# Patient Record
Sex: Male | Born: 1955 | Race: White | Hispanic: No | Marital: Married | Smoking: Former smoker
Health system: Southern US, Community
[De-identification: ages and names within clinical notes are randomized; demographics above are authoritative.]

## PROBLEM LIST (undated history)

## (undated) ENCOUNTER — Ambulatory Visit (HOSPITAL_COMMUNITY): Admission: EM | Payer: Medicare Other | Source: Home / Self Care

## (undated) DIAGNOSIS — F329 Major depressive disorder, single episode, unspecified: Secondary | ICD-10-CM

## (undated) DIAGNOSIS — E785 Hyperlipidemia, unspecified: Secondary | ICD-10-CM

## (undated) DIAGNOSIS — R7303 Prediabetes: Secondary | ICD-10-CM

## (undated) DIAGNOSIS — J189 Pneumonia, unspecified organism: Secondary | ICD-10-CM

## (undated) DIAGNOSIS — J069 Acute upper respiratory infection, unspecified: Secondary | ICD-10-CM

## (undated) DIAGNOSIS — F32A Depression, unspecified: Secondary | ICD-10-CM

## (undated) DIAGNOSIS — F909 Attention-deficit hyperactivity disorder, unspecified type: Secondary | ICD-10-CM

## (undated) DIAGNOSIS — I10 Essential (primary) hypertension: Secondary | ICD-10-CM

## (undated) DIAGNOSIS — Z720 Tobacco use: Secondary | ICD-10-CM

## (undated) DIAGNOSIS — F419 Anxiety disorder, unspecified: Secondary | ICD-10-CM

## (undated) DIAGNOSIS — E7800 Pure hypercholesterolemia, unspecified: Secondary | ICD-10-CM

## (undated) DIAGNOSIS — I209 Angina pectoris, unspecified: Secondary | ICD-10-CM

## (undated) DIAGNOSIS — H9190 Unspecified hearing loss, unspecified ear: Secondary | ICD-10-CM

## (undated) HISTORY — DX: Hyperlipidemia, unspecified: E78.5

## (undated) HISTORY — DX: Essential (primary) hypertension: I10

## (undated) HISTORY — DX: Major depressive disorder, single episode, unspecified: F32.9

## (undated) HISTORY — PX: COLONOSCOPY: SHX174

## (undated) HISTORY — DX: Depression, unspecified: F32.A

## (undated) HISTORY — PX: WISDOM TOOTH EXTRACTION: SHX21

---

## 1997-11-19 ENCOUNTER — Ambulatory Visit (HOSPITAL_COMMUNITY): Admission: RE | Admit: 1997-11-19 | Discharge: 1997-11-19 | Payer: Self-pay | Admitting: Family Medicine

## 1999-12-09 ENCOUNTER — Encounter: Payer: Self-pay | Admitting: Occupational Medicine

## 1999-12-09 ENCOUNTER — Encounter: Admission: RE | Admit: 1999-12-09 | Discharge: 1999-12-09 | Payer: Self-pay | Admitting: Occupational Medicine

## 2001-12-22 ENCOUNTER — Encounter: Payer: Self-pay | Admitting: Family Medicine

## 2001-12-22 ENCOUNTER — Encounter: Admission: RE | Admit: 2001-12-22 | Discharge: 2001-12-22 | Payer: Self-pay | Admitting: Family Medicine

## 2003-06-14 ENCOUNTER — Emergency Department (HOSPITAL_COMMUNITY): Admission: EM | Admit: 2003-06-14 | Discharge: 2003-06-14 | Payer: Self-pay

## 2003-12-19 ENCOUNTER — Encounter: Admission: RE | Admit: 2003-12-19 | Discharge: 2003-12-19 | Payer: Self-pay | Admitting: Occupational Medicine

## 2004-08-11 ENCOUNTER — Ambulatory Visit: Payer: Self-pay | Admitting: Gastroenterology

## 2004-08-12 ENCOUNTER — Ambulatory Visit: Payer: Self-pay | Admitting: Gastroenterology

## 2004-08-12 ENCOUNTER — Encounter (INDEPENDENT_AMBULATORY_CARE_PROVIDER_SITE_OTHER): Payer: Self-pay | Admitting: *Deleted

## 2006-01-25 ENCOUNTER — Encounter: Admission: RE | Admit: 2006-01-25 | Discharge: 2006-01-25 | Payer: Self-pay | Admitting: Occupational Medicine

## 2007-11-09 ENCOUNTER — Inpatient Hospital Stay (HOSPITAL_COMMUNITY): Admission: EM | Admit: 2007-11-09 | Discharge: 2007-11-10 | Payer: Self-pay | Admitting: Emergency Medicine

## 2007-11-09 ENCOUNTER — Ambulatory Visit: Payer: Self-pay | Admitting: Infectious Disease

## 2007-11-10 ENCOUNTER — Inpatient Hospital Stay (HOSPITAL_COMMUNITY): Admission: RE | Admit: 2007-11-10 | Discharge: 2007-11-17 | Payer: Self-pay | Admitting: *Deleted

## 2007-11-10 ENCOUNTER — Ambulatory Visit: Payer: Self-pay | Admitting: *Deleted

## 2007-12-13 ENCOUNTER — Encounter (INDEPENDENT_AMBULATORY_CARE_PROVIDER_SITE_OTHER): Payer: Self-pay | Admitting: *Deleted

## 2007-12-13 ENCOUNTER — Ambulatory Visit: Payer: Self-pay | Admitting: Internal Medicine

## 2007-12-13 DIAGNOSIS — F3289 Other specified depressive episodes: Secondary | ICD-10-CM | POA: Insufficient documentation

## 2007-12-13 DIAGNOSIS — E785 Hyperlipidemia, unspecified: Secondary | ICD-10-CM | POA: Insufficient documentation

## 2007-12-13 DIAGNOSIS — I10 Essential (primary) hypertension: Secondary | ICD-10-CM | POA: Insufficient documentation

## 2007-12-13 DIAGNOSIS — F329 Major depressive disorder, single episode, unspecified: Secondary | ICD-10-CM | POA: Insufficient documentation

## 2007-12-13 DIAGNOSIS — L299 Pruritus, unspecified: Secondary | ICD-10-CM | POA: Insufficient documentation

## 2007-12-13 DIAGNOSIS — I781 Nevus, non-neoplastic: Secondary | ICD-10-CM | POA: Insufficient documentation

## 2007-12-13 LAB — CONVERTED CEMR LAB
AST: 23 units/L (ref 0–37)
Albumin: 4.8 g/dL (ref 3.5–5.2)
Alkaline Phosphatase: 63 units/L (ref 39–117)
Glucose, Bld: 127 mg/dL — ABNORMAL HIGH (ref 70–99)
HDL: 42 mg/dL (ref >39)
LDL Cholesterol: 194 mg/dL — ABNORMAL HIGH (ref 0–99)
Potassium: 4.4 meq/L (ref 3.5–5.3)
Sodium: 139 meq/L (ref 135–145)
Total Bilirubin: 0.6 mg/dL (ref 0.3–1.2)
Total CHOL/HDL Ratio: 6.4
Total Protein: 8 g/dL (ref 6.0–8.3)
Triglycerides: 161 mg/dL — ABNORMAL HIGH (ref <150)
VLDL: 32 mg/dL (ref 0–40)

## 2008-02-23 ENCOUNTER — Ambulatory Visit: Payer: Self-pay | Admitting: Internal Medicine

## 2008-05-14 ENCOUNTER — Ambulatory Visit: Payer: Self-pay | Admitting: Internal Medicine

## 2008-06-15 ENCOUNTER — Ambulatory Visit: Payer: Self-pay | Admitting: Internal Medicine

## 2008-06-15 ENCOUNTER — Encounter (INDEPENDENT_AMBULATORY_CARE_PROVIDER_SITE_OTHER): Payer: Self-pay | Admitting: *Deleted

## 2008-06-15 LAB — CONVERTED CEMR LAB
ALT: 21 units/L (ref 0–53)
BUN: 19 mg/dL (ref 6–23)
CO2: 27 meq/L (ref 19–32)
Calcium: 10.1 mg/dL (ref 8.4–10.5)
Chloride: 96 meq/L (ref 96–112)
Cholesterol: 225 mg/dL — ABNORMAL HIGH (ref 0–200)
LDL Cholesterol: 158 mg/dL — ABNORMAL HIGH (ref 0–99)
Potassium: 4.5 meq/L (ref 3.5–5.3)
Total CHOL/HDL Ratio: 5.5
Triglycerides: 130 mg/dL (ref <150)
VLDL: 26 mg/dL (ref 0–40)

## 2008-07-10 ENCOUNTER — Telehealth (INDEPENDENT_AMBULATORY_CARE_PROVIDER_SITE_OTHER): Payer: Self-pay | Admitting: Internal Medicine

## 2008-10-17 ENCOUNTER — Ambulatory Visit: Payer: Self-pay | Admitting: Internal Medicine

## 2008-10-17 ENCOUNTER — Ambulatory Visit (HOSPITAL_COMMUNITY): Admission: RE | Admit: 2008-10-17 | Discharge: 2008-10-17 | Payer: Self-pay | Admitting: Internal Medicine

## 2008-10-17 DIAGNOSIS — M79609 Pain in unspecified limb: Secondary | ICD-10-CM | POA: Insufficient documentation

## 2008-10-17 LAB — CONVERTED CEMR LAB
BUN: 14 mg/dL (ref 6–23)
CO2: 28 meq/L (ref 19–32)
Calcium: 9.8 mg/dL (ref 8.4–10.5)
Chloride: 101 meq/L (ref 96–112)
Cholesterol: 268 mg/dL — ABNORMAL HIGH (ref 0–200)
Creatinine, Ser: 0.9 mg/dL (ref 0.40–1.50)
Glucose, Bld: 99 mg/dL (ref 70–99)
LDL Cholesterol: 194 mg/dL — ABNORMAL HIGH (ref 0–99)
Total CHOL/HDL Ratio: 5.5
VLDL: 25 mg/dL (ref 0–40)

## 2008-10-25 ENCOUNTER — Telehealth: Payer: Self-pay | Admitting: Internal Medicine

## 2008-11-13 ENCOUNTER — Telehealth: Payer: Self-pay | Admitting: Internal Medicine

## 2008-11-26 ENCOUNTER — Telehealth (INDEPENDENT_AMBULATORY_CARE_PROVIDER_SITE_OTHER): Payer: Self-pay | Admitting: *Deleted

## 2009-04-17 ENCOUNTER — Telehealth: Payer: Self-pay | Admitting: Internal Medicine

## 2009-07-01 ENCOUNTER — Encounter: Admission: RE | Admit: 2009-07-01 | Discharge: 2009-07-01 | Payer: Self-pay | Admitting: Internal Medicine

## 2009-09-26 ENCOUNTER — Encounter (INDEPENDENT_AMBULATORY_CARE_PROVIDER_SITE_OTHER): Payer: Self-pay | Admitting: *Deleted

## 2009-11-06 ENCOUNTER — Encounter (INDEPENDENT_AMBULATORY_CARE_PROVIDER_SITE_OTHER): Payer: Self-pay | Admitting: *Deleted

## 2009-11-07 ENCOUNTER — Ambulatory Visit: Payer: Self-pay | Admitting: Gastroenterology

## 2009-11-21 ENCOUNTER — Ambulatory Visit: Payer: Self-pay | Admitting: Gastroenterology

## 2009-11-24 ENCOUNTER — Encounter: Payer: Self-pay | Admitting: Gastroenterology

## 2010-01-12 HISTORY — PX: CARDIAC CATHETERIZATION: SHX172

## 2010-02-11 NOTE — Letter (Signed)
Summary: Patient Notice- Polyp Results  [facility]  [address] [place], [place] [address]   Phone: [phone]  Fax: [phone]        November 24, 2009 MRN: [id number]    [facility] [address] [place], [place]  [address]    Dear Mr. PANAS,  I am pleased to inform you that the colon polyp(s) removed during your recent colonoscopy was (were) found to be benign (no cancer detected) upon pathologic examination.  I recommend you have a repeat colonoscopy examination in 5 years to look for recurrent polyps, as having colon polyps increases your risk for having recurrent polyps or even colon cancer in the future.  Should you develop new or worsening symptoms of abdominal pain, bowel habit changes or bleeding from the rectum or bowels, please schedule an evaluation with either your primary care physician or with me.  Continue treatment plan as outlined the day of your exam.  Please call us if you are having persistent problems or have questions about your condition that have not been fully answered at this time.  Sincerely,  Meryl Dare MD [facility]  This letter has been electronically signed by your physician.  Appended Document: Patient Notice- Polyp Results letter mailed

## 2010-02-11 NOTE — Letter (Signed)
Summary: Pre Visit Letter Revised  [facility]  [address] [place], [place] [address]   Phone: [phone]  Fax: [phone]        09/26/2009 MRN: [id number] [facility] [address] [place], [place]  [address]             Procedure Date:  11-21-09   Welcome to the Gastroenterology Division at [facility].    You are scheduled to see a nurse for your pre-procedure visit on 11-07-09 at 8:00a.m. on the 3rd floor at [facility], [address]  We ask that you try to arrive at our office 15 minutes prior to your appointment time to allow for check-in.  Please take a minute to review the attached form.  If you answer "Yes" to one or more of the questions on the first page, we ask that you call the person listed at your earliest opportunity.  If you answer "No" to all of the questions, please complete the rest of the form and bring it to your appointment.    Your nurse visit will consist of discussing your medical and surgical history, your immediate family medical history, and your medications.   If you are unable to list all of your medications on the form, please bring the medication bottles to your appointment and we will list them.  We will need to be aware of both prescribed and over the counter drugs.  We will need to know exact dosage information as well.    Please be prepared to read and sign documents such as consent forms, a financial agreement, and acknowledgement forms.  If necessary, and with your consent, a friend or relative is welcome to sit-in on the nurse visit with you.  Please bring your insurance card so that we may make a copy of it.  If your insurance requires a referral to see a specialist, please bring your referral form from your primary care physician.  No co-pay is required for this nurse visit.     If you cannot keep your appointment, please call [phone] to cancel or reschedule prior to your appointment date.  This  allows [place] the opportunity to schedule an appointment for another patient in need of care.    Thank you for choosing [facility] for your medical needs.  We appreciate the opportunity to care for you.  Please visit [place] at our website  to learn more about our practice.  Sincerely, The Gastroenterology Division

## 2010-02-11 NOTE — Miscellaneous (Signed)
Summary: Forgot 8am appt; will be here at 3pm/  Clinical Lists Changes  Medications: Added new medication of MOVIPREP 100 GM  SOLR (PEG-KCL-NACL-NASULF-NA ASC-C) As directed - Signed Rx of MOVIPREP 100 GM  SOLR (PEG-KCL-NACL-NASULF-NA ASC-C) As directed;  #1 x 0;  Signed;  Entered by: Clide Cliff RN;  Authorized by: Meryl Dare MD [facility];  Method used: Electronically to [facility]  [phone]*, [address], [place], [place], [place]  [address], Ph: [phone] or [phone], Fax: [phone] Observations: Added new observation of ALLERGY REV: Done (11/07/2009 14:43)    Prescriptions: MOVIPREP 100 GM  SOLR (PEG-KCL-NACL-NASULF-NA ASC-C) As directed  #1 x 0   Entered by:   Clide Cliff RN   Authorized by:   Meryl Dare MD [facility]   Signed by:   Clide Cliff RN on 11/07/2009   Method used:   Electronically to        [facility]  [phone]* (retail)       [address]       [place], [place]  [address]       Ph: [phone] or [phone]       Fax: [phone]   RxID:   [id number]

## 2010-02-11 NOTE — Progress Notes (Signed)
Summary: Refill/gh  Phone Note Refill Request Message from:  Fax from Pharmacy on April 17, 2009 3:37 PM  Refills Requested: Medication #1:  LISINOPRIL-HYDROCHLOROTHIAZIDE 20-25 MG TABS Take 1 tablet by mouth once a day   Last Refilled: 03/01/2009 Last office visit and labs was 10/17/2008   Method Requested: Electronic Initial call taken by: Angelina Ok RN,  April 17, 2009 3:37 PM  Follow-up for Phone Call        Refill approved-nurse to complete Will only give one month supply, patient needs to schedule an appointment.  Follow-up by: Melida Quitter MD,  April 17, 2009 8:06 PM  Additional Follow-up for Phone Call Additional follow up Details #1::        Pt to be scheduled for an appointment. Additional Follow-up by: Angelina Ok RN,  April 18, 2009 3:27 PM    Prescriptions: LISINOPRIL-HYDROCHLOROTHIAZIDE 20-25 MG TABS (LISINOPRIL-HYDROCHLOROTHIAZIDE) Take 1 tablet by mouth once a day  #30 x 0   Entered and Authorized by:   Melida Quitter MD   Signed by:   Melida Quitter MD on 04/17/2009   Method used:   Electronically to        [facility]  [phone]* (retail)       [address]       [place], [place]  [address]       Ph: [phone] or [phone]       Fax: [phone]   RxID:   [id number]

## 2010-02-11 NOTE — Procedures (Signed)
Summary: Colonoscopy  Patient: George Graham Note: All result statuses are Final unless otherwise noted.  Tests: (1) Colonoscopy (COL)   COL Colonoscopy           DONE     [facility]     [address]     [place], [place]  [address]           COLONOSCOPY PROCEDURE REPORT           PATIENT:  [name], [name]  MR#:  [id number]     BIRTHDATE:  1955/02/16, 53 yrs. old  GENDER:  male     ENDOSCOPIST:  Judie Petit T. Russella Dar, MD, [facility]           PROCEDURE DATE:  11/21/2009     PROCEDURE:  Colonoscopy with biopsy     ASA CLASS:  Class II     INDICATIONS:  1) surveillance and high-risk screening  2) history     of adenomatous colon polyp, 08/2004     MEDICATIONS:   Fentanyl 75 mcg IV, Versed 10 mg IV     DESCRIPTION OF PROCEDURE:   After the risks benefits and     alternatives of the procedure were thoroughly explained, informed     consent was obtained.  Digital rectal exam was performed and     revealed no abnormalities.   The LB [id number]-[id number] endoscope     was introduced through the anus and advanced to the cecum, which     was identified by both the appendix and ileocecal valve, without     limitations.  The quality of the prep was excellent, using     MoviPrep.  The instrument was then slowly withdrawn as the colon     was fully examined.     <<PROCEDUREIMAGES>>     FINDINGS:  A sessile polyp was found in the ascending colon. It     was 4 mm in size. The polyp was removed using cold biopsy forceps.     Mild diverticulosis was found in the sigmoid colon.  A normal     appearing cecum, ileocecal valve, and appendiceal orifice were     identified. The hepatic flexure, transverse, splenic flexure,     descending colon, and rectum appeared unremarkable. Retroflexed     views in the rectum revealed no abnormalities. The time to cecum =     2.67  minutes. The scope was then withdrawn (time =  8.33  min)     from the patient and the procedure completed.     COMPLICATIONS:   None           ENDOSCOPIC IMPRESSION:     1) 4 mm sessile polyp in the ascending colon     2) Mild diverticulosis in the sigmoid colon           RECOMMENDATIONS:     1) Await pathology results     2) High fiber diet with liberal fluid intake.     3) Repeat Colonoscopy in 5 years.           Venita Lick. Russella Dar, MD, [facility]           CC: Guerry Bruin, MD           n.     Rosalie DoctorVenita Lick. Stark at 11/21/2009 09:15 AM           [facility], [id number]  Note: An exclamation mark (!) indicates a result that was not dispersed  into the flowsheet. Document Creation Date: 11/21/2009 9:15 AM _______________________________________________________________________  (1) Order result status: Final Collection or observation date-time: 11/21/2009 09:09 Requested date-time:  Receipt date-time:  Reported date-time:  Referring Physician:   Ordering Physician: Claudette Head [phone]) Specimen Source:  Source: Launa Grill Order Number: [id number] Lab site:   Appended Document: Colonoscopy     Procedures Next Due Date:    Colonoscopy: 11/2014

## 2010-02-11 NOTE — Letter (Signed)
Summary: [facility] Instructions  [facility]  [address] [place], [place] [address]   Phone: [phone]  Fax: [phone]       KOUROSH JABLONSKY    11-17-1955    MRN: [id number]        Procedure Day Dorna Bloom:  Lenor Coffin  11/21/09     Arrival Time:  8:00AM     Procedure Time:  9:00AM     Location of Procedure:                    _ X_  [facility] (4th Floor)                    PREPARATION FOR COLONOSCOPY WITH MOVIPREP   Starting 5 days prior to your procedure 11/16/09 do not eat nuts, seeds, popcorn, corn, beans, peas,  salads, or any raw vegetables.  Do not take any fiber supplements (e.g. Metamucil, Citrucel, and Benefiber).  THE DAY BEFORE YOUR PROCEDURE         DATE: 11/20/09  DAY: WEDNESDAY  1.  Drink clear liquids the entire day-NO SOLID FOOD  2.  Do not drink anything colored red or purple.  Avoid juices with pulp.  No orange juice.  3.  Drink at least 64 oz. (8 glasses) of fluid/clear liquids during the day to prevent dehydration and help the prep work efficiently.  CLEAR LIQUIDS INCLUDE: Water Jello Ice Popsicles Tea (sugar ok, no milk/cream) Powdered fruit flavored drinks Coffee (sugar ok, no milk/cream) Gatorade Juice: apple, white grape, white cranberry  Lemonade Clear bullion, consomm, broth Carbonated beverages (any kind) Strained chicken noodle soup Hard Candy                             4.  In the morning, mix first dose of MoviPrep solution:    Empty 1 Pouch A and 1 Pouch B into the disposable container    Add lukewarm drinking water to the top line of the container. Mix to dissolve    Refrigerate (mixed solution should be used within 24 hrs)  5.  Begin drinking the prep at 5:00 p.m. The MoviPrep container is divided by 4 marks.   Every 15 minutes drink the solution down to the next mark (approximately 8 oz) until the full liter is complete.   6.  Follow completed prep with 16 oz of clear liquid of your choice  (Nothing red or purple).  Continue to drink clear liquids until bedtime.  7.  Before going to bed, mix second dose of MoviPrep solution:    Empty 1 Pouch A and 1 Pouch B into the disposable container    Add lukewarm drinking water to the top line of the container. Mix to dissolve    Refrigerate  THE DAY OF YOUR PROCEDURE      DATE: 11/21/09  DAY: THURSDAY  Beginning at 4:00AM (5 hours before procedure):         1. Every 15 minutes, drink the solution down to the next mark (approx 8 oz) until the full liter is complete.  2. Follow completed prep with 16 oz. of clear liquid of your choice.    3. You may drink clear liquids until 7:00AM (2 HOURS BEFORE PROCEDURE).   MEDICATION INSTRUCTIONS  Unless otherwise instructed, you should take regular prescription medications with a small sip of water   as early as possible the morning of your procedure.  OTHER INSTRUCTIONS  You will need a responsible adult at least 55 years of age to accompany you and drive you home.   This person must remain in the waiting room during your procedure.  Wear loose fitting clothing that is easily removed.  Leave jewelry and other valuables at home.  However, you may wish to bring a book to read or  an iPod/MP3 player to listen to music as you wait for your procedure to start.  Remove all body piercing jewelry and leave at home.  Total time from sign-in until discharge is approximately 2-3 hours.  You should go home directly after your procedure and rest.  You can resume normal activities the  day after your procedure.  The day of your procedure you should not:   Drive   Make legal decisions   Operate machinery   Drink alcohol   Return to work  You will receive specific instructions about eating, activities and medications before you leave.    The above instructions have been reviewed and explained to me by   Clide Cliff, RN______________________    I fully  understand and can verbalize these instructions _____________________________ Date _________

## 2010-05-27 NOTE — H&P (Signed)
NAMEFRASER, BUSCHE NO.:  [id number]   MEDICAL RECORD NO.:  [id number]          PATIENT TYPE:  IPS   LOCATION:  0303                          FACILITY:  [facility]   PHYSICIAN:  Jasmine Pang, M.D. DATE OF BIRTH:  03/15/1955   DATE OF ADMISSION:  11/10/2007  DATE OF DISCHARGE:                       PSYCHIATRIC ADMISSION ASSESSMENT   IDENTIFICATION:  A 55 year old divorced Caucasian male.  This is an  involuntary admission.   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  His initial  alcohol level was 333.  He was also initially quite confused and  verbalized to his brother that he intended to load his guns and shoot  himself.  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  Since that time, he has also broken up  with his girlfriend, lost a large amount of savings in his investments,  and now stands to lose his housing.  He feels hopeless about the future  and his ability to get back into the job market, denying any homicidal  thoughts.  No evidence of psychosis.  Please also see the discharge  summary dictated by Dr. Daiva Eves on the medical unit on October 29.   PAST PSYCHIATRIC HISTORY:  First [facility] admission.  He is not currently  under the care of a psychiatrist or counselor at this time.  He has a  history of 2 admissions to [facility] in the distant past and at  that time had 2 DWIs.  Did quite well with many years of abstinence from  alcohol.  He denies any other substance use and is interested in  pursuing treatment following his detox stay here.  He  also denies a  prior history of suicide attempt.  He does report that he had some  history of delirium with alcohol withdrawal in the past.   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  He reports he has very supportive family in the area, many  people concerned about him, and would like to stay in the area to pursue  rehab.   FAMILY MEDICAL HISTORY:  Primary care Lee-Anne Flicker is unclear.   MEDICAL PROBLEMS:  Are hypertension.   CURRENT MEDICATIONS:  1. Lisinopril 20 mg daily.  2. Hydrochlorothiazide 25 mg daily.   DRUG ALLERGIES:  No known drug allergies.   PHYSICAL EXAMINATION:  Was done in the emergency room, is noted in the  record.   Noted that he was admitted with urine drug screen negative for all  substances.  Initial alcohol level 333.  Salicylate and  acetaminophen  levels were negative.  Homocysteine level 27.  CBC:  WBC 5.7, hemoglobin  15.4, hematocrit 43.8, platelets 325,000, and MCV 103.  RDW 13.9.  Chemistry:  Sodium 127, potassium 4.4, chloride 89, carbon dioxide 27,  BUN 6, creatinine 0.8 and random glucose 104.  Liver enzymes:  SGOT 54,  SGPT 65, alkaline phosphatase 64 and total bilirubin 1.2, calcium 10.5.   MENTAL STATUS EXAM:  Fully alert male.  He is oriented x4 today.  Immediate, recent and remote memory are intact, although he does not  have clear memory of the events leading up immediately to the admission.  Acknowledges that he was drinking large amounts of alcohol but that his  drinking was out of control.  Denying any symptoms of fornication,  tremor, or subjective confusion today.  Speech is normal.  Eye contact  is good.  Cooperative but asking for help with his detox and with his  drinking.  Denying that he has any intent to kill myself and says that  he is here to detox, clean up, so I can get back to looking for a job.  Mood is neutral  today.  Thought process logical and coherent.  No  evidence of confusion or delirium today.  Insight adequate.  Impulse  control and judgment within normal limits.   AXIS I:  Alcohol abuse and dependence.  AXIS II:  Deferred.  AXIS III:  Hypertension and elevated transaminases.  AXIS IV:  Severe issues with finances.  AXIS V:  Current 40, past year not known.   PLAN:  Is to admit him to our dual diagnosis unit.  We started him on a  Librium detox protocol, and he will also be getting thiamine 100 mg  daily, folic acid 1 mg daily.  We will recheck his complete metabolic  panel in a couple of days.  Meanwhile, he reports that his family is  very supportive, and we hope to get them involved in his treatment and  going to ask our social worker to talk with the family about securing  the weapons at home.      Margaret A. Lorin Picket, N.P.      Jasmine Pang, M.D.  Electronically Signed    MAS/MEDQ  D:  11/11/2007  T:  11/11/2007  Job:  [id number]

## 2010-05-27 NOTE — Discharge Summary (Signed)
George Graham, George Graham NO.:  [id number]   MEDICAL RECORD NO.:  [id number]          PATIENT TYPE:  INP   LOCATION:  6708                         FACILITY:  [facility]   PHYSICIAN:  Acey Lav, MD  DATE OF BIRTH:  01-Feb-1955   DATE OF ADMISSION:  11/08/2007  DATE OF DISCHARGE:  11/10/2007                               DISCHARGE SUMMARY   ATTENDING PHYSICIAN:  Dr. Paulette Blanch Graham.   DISCHARGE DIAGNOSES:  1. Suicidal ideation.  2. Likely major depressive disorder episode.  3. Hypertensive urgency.  4. Acute ethanol intoxication.  5. Likely ethanol abuse   DISCHARGE MEDICATIONS:  1. P.o. hydrochlorothiazide 25 mg daily.  2. P.o. lisinopril 20 mg daily.   CONDITION ON DISCHARGE:  The patient is to be discharged to the  behavioral health center where he should undergo inpatient psychiatric  rehabilitation.  The patient is otherwise medically stable with a last  blood pressure of 146/80.  The patient will follow up with myself, Dr.  Linward Graham, on December 1 at 9:00 a.m.  His mental state should be  assessed at that time as well as his blood pressure and compliance with  his new medications.  We will evaluate whether the patient was placed on  an antidepressant at [facility] or determine whether an  antidepressant is warranted at that time of follow-up.   PROCEDURES:  The patient had a head CT without contrast on November 09, 2007 that showed no acute intracranial abnormality.  The patient had a  chest x-ray on November 09, 2007 that showed no acute findings.   CONSULTATIONS:  Dr. Jeanie Sewer of psychiatry consulted the patient on  November 10, 2007.  Per his assessment and plan, the patient is to be  admitted to a psychiatric facility as soon as possible.  Psychotropics  will be deferred at this point.  Please see Dr. Providence Crosby dictation  for further details.   ADMISSION HISTORY AND PHYSICAL:  The patient is a 55 year old male with  a history of  hypertension and depression with no previous suicidal  attempts and no hospitalizations who presents after being brought in by  his older brother for alcohol intoxication plus suicidal ideation.  The  patient lost his job around 10 months ago, lost his girlfriend around 1  month ago and is about to be evicted from his home.  The patient had a  suicidal plan to drink himself to death and had loaded guns at home.  The patient has a history of depression and had been on Prozac until  just after the patient lost his job approximately 9 months ago.  The  patient had been drinking heavily over the last few weeks and was  drinking up until 10:00 p.m. on the day prior to admission.  The patient  reports having decreased appetite, decreased energy and decreased  interest.   PHYSICAL EXAMINATION:  VITAL SIGNS: Temperature 98.2, blood pressure of  183/120.  This was brought down to 179/108 with labetalol in the ED,  pulse of 106, respiratory rate 14, O2 sat 95% on room air.   GENERAL:  Mildly nervous.  HEENT:  Eyes:  Bloodshot bilaterally.  EENT:  Moist membranous mucosa.  RESPIRATORY:  Clear to auscultation bilaterally.  CARDIOVASCULAR:  Tachycardiac, regular rhythm.  No murmurs, rubs,  gallops.  GI:  Soft, obese, nontender, nondistended.  EXTREMITIES:  No cyanosis, clubbing or edema.  Yellow, ash discoloration  of the fingernails.  SKIN:  Flushed face.  MUSCULOSKELETAL:  No deformities.  NEURO:  Alert and oriented x3, nonfocal. Mild diffuse shakes.  PSYCHIATRIC:  The patient is not currently suicidal per the patient's  report.   INITIAL LABS:  Sodium 133, potassium 4.1, chloride of 91, bicarbonate of  30, BUN of 9, creatinine of 0.59, glucose of 105, anion gap of 11. White  blood cell count of 7.1, hemoglobin 15.3, platelets of 400, ANC of 4.7,  MCV 104 Tylenol level less than 10.  Ethanol level of 333, salicylate  level less than 10.  Negative UDS. Negative UA.   HOSPITAL COURSE:   Problem 1:  Suicidal ideation:  The patient presented  as suicidal with a plan to drink himself to death. For this reason, he  was evaluated upon admission by behavioral health for direct admission,  but was admitted to [facility] 2/2 hypertensive urgency and acute alcohol  intoxication. A sitter was with the patient at all times throughout the  course of his hospitalization.  The patient has likely underlying  depression and had been treated with Prozac in the past.  However, with  the acute change in his life circumstances secondary to his loss of job,  girlfriend, and housing, the patient had suicidal ideations with a  possible underlying major depressive episode.  Dr. Jeanie Sewer of  psychiatry was consulted and evaluated the patient.  He assessed that  the patient needed inpatient psychiatric rehabilitation and the patient  was amendable to this.  The patient appears to have a supportive family  as they came and stayed with the patient for much of the second day of  hospitalization.  With proper inpatient psychiatric follow-up and  outpatient follow-up through our outpatient clinic, we hope that patient  stays stabilized.  Problem 2:  Hypertensive urgency:  The patient received labetalol and  HCTZ in the ED.  We started him on his lisinopril and continued his HCTZ  and monitored his blood pressure. His blood pressure came down and  remained stable in the systolic 146-155 range on the second day of  admission.  He should continue on those medications, and we will follow  him up in the outpatient clinic.  The patient likely suffers from  essential hypertension and may have had anxiety/ethanol related increase  in blood pressure leading to his diagnosis of hypertensive urgency.  Problem 3:  Ethanol intoxication:  The patient's initial serum blood  alcohol was at 333 on admission.  The patient had a significant increase  in alcohol use over the last few weeks to months and had no previous   withdrawal symptoms.  He was monitored with CIWA protocol and given  thiamine and folate. He did not receive any Ativan or other BZD during  the hospitilization. A CMET was checked for hepatic toxicity, and his  AST and ALT were mildly elevated at 54 and 65 respectively.  The patient  should receive further alcohol cessation or alcohol reduction counseling  at his inpatient psychiatric rehabilitation or as an outpatient.  He  denied any acetaminophen, aspirin, or other drugs and was negative for  all the above on testing.  Problem 4:  Tobacco abuse:  The patient was given a nicotine patch.  Given the patient's fragile state of mind, cessation counseling would  best be deferred until the patient is more stable.   PENDING LABS:  There are no pending labs at this time.   DISCHARGE LABS:  The patient's last BMET sodium 127, potassium 4.4,  chloride of 89, bicarbonate 27, glucose 104, BUN of 6, creatinine of  0.8.  The patient's last CBC hemoglobin of 15.4, hematocrit of 43.8,  white blood cell count of 5.7, platelet count 325.      George Foster, MD  Electronically Signed      Acey Lav, MD  Electronically Signed    LW/MEDQ  D:  11/10/2007  T:  11/10/2007  Job:  [id number]   cc:   Antonietta Breach, M.D.

## 2010-05-27 NOTE — Discharge Summary (Signed)
NAMEMAXTEN, SHULER NO.:  [id number]   MEDICAL RECORD NO.:  [id number]          PATIENT TYPE:  IPS   LOCATION:  0303                          [facility]ACILITY:  [facility]   PHYSICIAN:  Jasmine Pang, M.D. DATE OF BIRTH:  24-Dec-1955   DATE OF ADMISSION:  11/10/2007  DATE OF DISCHARGE:  11/17/2007                               DISCHARGE SUMMARY   IDENTIFICATION:  This is a 55 year old divorced Caucasian male who was  admitted on an involuntary basis on November 10, 2007.   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  He reports he had been drinking so much wine  that he could not even keep track how much he drinking.  His initial  alcohol level was 333.  He was also initially quite confused and  verbalized to his brother that he intended to load his guns and shoot  himself.  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  Since that time, he is also broken up with  his girlfriend, lost a large amount of savings in his investments and  now stands to lose his housing.  He feels hopeless about the future and  his ability to get back to the job market.  He is denying any homicidal  thoughts.  There is no evidence of psychosis.   PAST PSYCHIATRIC HISTORY:  This is the first [facility] admission.  He is not  currently under the care of a psychiatrist or counselor.  At this time,  he has a history of 2 admissions to [facility] in the distant past  and at that time he had 2 DWIs.  He did quite well with many years of  abstinence from alcohol.  He denies other substance use and is  interested in pursuing treatment following his detox here.  He also  denies prior history of  suicide attempt.  He does report that he has had  some history of delirium with alcohol withdrawal in the past.   FAMILY MEDICAL HISTORY:  Medical problems are hypertension.   MEDICATIONS:  Lisinopril 20 mg daily and hydrochlorothiazide 25 mg  daily.   DRUG ALLERGIES:  No known drug allergies.   PHYSICAL EXAM:  Physical exam was done in the emergency room.  There  were no acute physical or medical problems noted.   DIAGNOSTIC STUDIES:  The patient was admitted with a urine drug screen  negative for all substances.  His initial alcohol level was 333.  Salicylate was 43.8.  Platelets 325,000, MCV 103, RDW 13.9.  Chemistries  revealed a sodium of 127, potassium of 4.4., chloride of 89, carbon  dioxide 27, BUN 6, creatinine 0.8, and random glucose 104.  Liver  enzymes revealed SGOT of 54, SGPT of 65, alkaline phosphatase of 64,  and  total bilirubin of 1.2.  Calcium was 10.5.   HOSPITAL COURSE:  Upon admission, the patient was continued on his home  medications of hydrochlorothiazide 25 mg daily and lisinopril 20 mg  daily.  He was also started on Librium detox protocol and Ambien 10 mg  p.o. q.h.s. p.r.n. insomnia.  He was started on folic acid 1 mg daily.  He complained of a large blister on the bottom of his left foot.  This  was evaluated by our nurse practitioner and he was started on  doxycycline 100 mg b.i.d. x7 days.  In individual sessions with me, the  patient was friendly and cooperative.  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  He  states he has been drinking a lot for the past month.  The night that he  went to the ED, he had been drinking quite a bit.  He told to his  brother, he was trying to drink himself to death.  Brother was also  concerned because he had a gun that was in his apartment.  The patient  states he was not wanting to hurt himself and  realizes he was  intoxicated.  At that time, he made the statements.  He was admitted to  the hospital because of elevated blood pressure.  He saw Dr. Jeanie Sewer  there in a consult, who referred him to our unit.  He states he has also  had a diagnosis of ADHD and treated with Ritalin in the past.  On  November 12, 2007, sleep was good.  Appetite was good.  Mood was less  depressed, less anxious.  Brother and daughter visited last night.  Brother will help with payment for the apartment.  He discussed his  breakup with his girlfriend 1 month ago.  He states she was  controlling.  On November 13, 2007, he continued to be less depressed,  less anxious.  He was looking at possible places to get a job and  possible places to live.  On November 14, 2007, he and his brother had a  family session with our counselor.  His brother was very supportive, but  states that the patient's apartment is uninhabitable and that there are  200+ wine bottles in the apartment.  Brother said there are insects in  the refrigerator.  Brother wanted the patient to make him power of  attorney, so he can help with the patient's finances in order.  Brother  also wanted him to go to a rehab program after discharge from here.  The  patient states he does not want to be in a group setting I do not want  to live with other people.  He feels that he cannot get a job if living  in a residential setting.  On November 15, 2007, there was some  dysphoria, because family was pushing for fellowship hall.  However,  his affect was wide range.  He stated he did not want to stay in a 28-  day program, but brother and cousin have been visiting and trying to  talk him into this.  His cousin has a 5-year history of recovery.  On  November 16, 2007, there was some anxiety, but he was not depressed.  The  brother and cousin have encouraged the patient to go to a longer-term  rehab program.  He was accepting of this disposition.  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  On November 17, 2007, mood was less depressed, less anxious.  Affect was consistent with mood.  There was no suicidal or homicidal  ideation.  No thoughts of self-injurious behavior.  No auditory or  visual hallucinations.  No paranoia or delusions.  Thoughts were logical  and goal-directed.  Thought content.  No predominant theme.  Cognitive  was grossly intact.  Insight good.  Judgment good.  Impulse control  good.  He was excited about going to the [facility] for rehab in  [place].  The patient was restarted on his Prozac at his request  because he states he felt that this is helped his depression in the past  and he does feel somewhat depressed.   DISCHARGE DIAGNOSES:  Axis I: Alcohol dependence, depressive disorder,  not otherwise specified.  Axis II:  None.  Axis III:  Hypertension and elevated transaminases.  Axis IV:  Severe (issues with finances, housing, occupation, burden of  psychiatric and chemical dependence illness, burden of.  medical  problems).  Axis V:  Global assessment of functioning was 50 upon discharge.  GAF  was 40 upon admission.  GAF highest past year was 65.   DISCHARGE PLANS:  There was no specific activity level or dietary  restrictions.   POSTHOSPITAL CARE PLANS:  The patient will go to the retreat counseling  center on November 5 at 5 p.m.  for continued rehab support.  He will  also go to [facility] when he returns to the community.   DISCHARGE MEDICATIONS:  1. Doxycycline 100 mg twice a day till finished.  2. Lisinopril/hydrochlorothiazide 30/25 mg daily.  3. Prozac was started at 20 mg daily in the morning.      Jasmine Pang, M.D.  Electronically Signed     BHS/MEDQ  D:  11/17/2007  T:  11/18/2007  Job:  [id number]

## 2010-05-27 NOTE — Consult Note (Signed)
NAMEJEMERY, George Graham NO.:  [id number]   MEDICAL RECORD NO.:  [id number]          PATIENT TYPE:  INP   LOCATION:  6708                         FACILITY:  [facility]   PHYSICIAN:  Antonietta Breach, M.D.  DATE OF BIRTH:  1955-04-04   DATE OF CONSULTATION:  11/10/2007  DATE OF DISCHARGE:                                 CONSULTATION   REASON FOR CONSULTATION:  Suicide attempt.   REQUESTING PHYSICIAN:  Acey Lav, MD   HISTORY OF PRESENT ILLNESS:  Mr. Dittmar is a 55 year old divorced male  who was admitted to the [facility] on November 08, 2007, due to  suicidal ideation with a plan.   Mr. Nay has lost his job.  He has lost his girlfriend.  He stopped his  Prozac and he has had several months of return depression with depressed  mood, trouble with energy, difficulty getting out of bed, anhedonia,  thoughts of hopelessness and helplessness.  He binged on alcohol over  the weekend with an intent to kill himself.  He also had been thinking  about shooting himself with a gun and there was a gun in the house.  He  has not demonstrated any physiologic withdrawal from alcohol and he does  state that he has not been drinking every day.  He is cooperative.  He  is motivated for inpatient psychiatric care.  His orientation and memory  function are intact.  In addition to the above symptoms he says that he  has not slept well in several weeks.   PAST PSYCHIATRIC HISTORY:  He has no history of increased energy and  decreased need for sleep.  He has no history of suicide attempts or  hallucinations.   His only psychiatric care has consisted of Prozac being prescribed by  his primary care physician which was effective for major depression.  He  had to stop it due to money problems.   He used marijuana remotely in high school, otherwise no illegal drugs.   FAMILY PSYCHIATRIC HISTORY:  None known.   SOCIAL HISTORY:  See above.  He does live alone, although his brother  is  near by and came to check on him.   PAST MEDICAL HISTORY:  Essential hypertension which is currently being  treated.   MEDICATIONS:  1. He is on thiamine 100 mg daily.  2. Folic acid 1 mg daily.  3. Nicoderm 21 mg per day.   ALLERGIES:  No known drug allergies.   LABORATORY DATA:  Other general medical data, please see the attending's  documentation.   REVIEW OF SYSTEMS:  Noncontributory.   MENTAL STATUS EXAM:  Mr. Schulenburg has good eye contact.  He does  acknowledge suicidal intent.  Thought process within normal limits.  His  affect is anxious.  Mood is depressed.  Speech within normal limits.  He  is cooperative and socially appropriate.  Memory function is now intact.  He is oriented to all spheres.  Insight is intact with the need of  treatment.  Judgment is intact for the need of dual diagnosis treatment.   ASSESSMENT:  AXIS I:  1. 293.83, mood disorder not otherwise specified, depressed.  2. Rule out 296.33, major depressive disorder recurrent severe.  3. Alcohol dependence without physiologic dependence, provisional.  AXIS II:  Deferred.  AXIS III:  See past medical history and the attending's documentation.  AXIS IV:  Occupational, economic, primary support group.  AXIS V:  30.   Mr. Tiberio is at risk to continue alcohol binging as well as attempt  suicide.   RECOMMENDATIONS:  1. Would admit to an inpatient psychiatric unit.  2. The undersigned did discuss the indications, alternatives and      adverse effects of Prozac and trazodone including trazodone's risk      for priapism resulting in surgery induced impotence.   The patient does understand and is motivated to take those medications.  However, they will be deferred to his inpatient psychiatric unit and the  opportunity for the patient and his inpatient psychiatrist to make  medication decisions.   The patient will benefit from a dual diagnosis admission where his  alcohol pattern can be addressed along  with his depression.   Would admit to an inpatient psychiatric unit with a dual diagnosis  program as soon as possible.      Antonietta Breach, M.D.  Electronically Signed     JW/MEDQ  D:  11/10/2007  T:  11/10/2007  Job:  [id number]

## 2010-06-25 ENCOUNTER — Encounter: Payer: Self-pay | Admitting: Internal Medicine

## 2010-10-14 LAB — BASIC METABOLIC PANEL
BUN: 6
Creatinine, Ser: 0.8
GFR calc Af Amer: >60
GFR calc Af Amer: >60
GFR calc non Af Amer: >60
Glucose, Bld: 104 — ABNORMAL HIGH
Potassium: 4.4
Sodium: 133 — ABNORMAL LOW

## 2010-10-14 LAB — CBC
Hemoglobin: 15.4
MCHC: 35.1
MCV: 103 — ABNORMAL HIGH
RBC: 4.25
RBC: 4.32
WBC: 5.7

## 2010-10-14 LAB — DIFFERENTIAL
Basophils Absolute: 0
Basophils Relative: 0
Eosinophils Absolute: 0.1
Lymphocytes Relative: 22
Lymphs Abs: 1.5
Monocytes Absolute: 0.7
Monocytes Relative: 10

## 2010-10-14 LAB — HOMOCYSTEINE: Homocysteine: 27 — ABNORMAL HIGH

## 2010-10-14 LAB — ACETAMINOPHEN LEVEL: Acetaminophen (Tylenol), Serum: <10 — ABNORMAL LOW

## 2010-10-14 LAB — URINALYSIS, ROUTINE W REFLEX MICROSCOPIC
Bilirubin Urine: NEGATIVE
Glucose, UA: NEGATIVE
Hgb urine dipstick: NEGATIVE
Ketones, ur: NEGATIVE
Nitrite: NEGATIVE
Urobilinogen, UA: 0.2

## 2010-10-14 LAB — COMPREHENSIVE METABOLIC PANEL
ALT: 65 — ABNORMAL HIGH
Calcium: 10.1
Chloride: 89 — ABNORMAL LOW
GFR calc Af Amer: >60
GFR calc non Af Amer: >60
Sodium: 130 — ABNORMAL LOW

## 2010-10-14 LAB — TRICYCLICS SCREEN, URINE: TCA Scrn: NOT DETECTED

## 2010-10-14 LAB — RAPID URINE DRUG SCREEN, HOSP PERFORMED: Amphetamines: NOT DETECTED

## 2010-10-14 LAB — ETHANOL
Alcohol, Ethyl (B): 229 — ABNORMAL HIGH
Alcohol, Ethyl (B): 333 — ABNORMAL HIGH

## 2010-10-14 LAB — SALICYLATE LEVEL: Salicylate Lvl: <4

## 2010-12-13 DIAGNOSIS — I209 Angina pectoris, unspecified: Secondary | ICD-10-CM

## 2010-12-13 HISTORY — PX: CARDIAC CATHETERIZATION: SHX172

## 2010-12-13 HISTORY — DX: Angina pectoris, unspecified: I20.9

## 2010-12-15 ENCOUNTER — Emergency Department (HOSPITAL_COMMUNITY): Payer: BC Managed Care – PPO

## 2010-12-15 ENCOUNTER — Emergency Department (HOSPITAL_COMMUNITY)
Admission: EM | Admit: 2010-12-15 | Discharge: 2010-12-16 | Disposition: A | Discharge status: Home / Self Care | Payer: BC Managed Care – PPO | Attending: Cardiology | Admitting: Cardiology

## 2010-12-15 ENCOUNTER — Encounter (HOSPITAL_COMMUNITY): Payer: Self-pay | Admitting: *Deleted

## 2010-12-15 ENCOUNTER — Other Ambulatory Visit: Payer: Self-pay

## 2010-12-15 DIAGNOSIS — Z720 Tobacco use: Secondary | ICD-10-CM | POA: Diagnosis present

## 2010-12-15 DIAGNOSIS — R0789 Other chest pain: Secondary | ICD-10-CM | POA: Insufficient documentation

## 2010-12-15 DIAGNOSIS — R079 Chest pain, unspecified: Secondary | ICD-10-CM | POA: Diagnosis present

## 2010-12-15 DIAGNOSIS — Z8249 Family history of ischemic heart disease and other diseases of the circulatory system: Secondary | ICD-10-CM | POA: Insufficient documentation

## 2010-12-15 DIAGNOSIS — F17200 Nicotine dependence, unspecified, uncomplicated: Secondary | ICD-10-CM | POA: Insufficient documentation

## 2010-12-15 DIAGNOSIS — E7800 Pure hypercholesterolemia, unspecified: Secondary | ICD-10-CM | POA: Diagnosis present

## 2010-12-15 DIAGNOSIS — E785 Hyperlipidemia, unspecified: Secondary | ICD-10-CM | POA: Insufficient documentation

## 2010-12-15 DIAGNOSIS — I10 Essential (primary) hypertension: Secondary | ICD-10-CM | POA: Insufficient documentation

## 2010-12-15 DIAGNOSIS — H9190 Unspecified hearing loss, unspecified ear: Secondary | ICD-10-CM | POA: Diagnosis present

## 2010-12-15 HISTORY — DX: Tobacco use: Z72.0

## 2010-12-15 HISTORY — DX: Unspecified hearing loss, unspecified ear: H91.90

## 2010-12-15 HISTORY — DX: Pure hypercholesterolemia, unspecified: E78.00

## 2010-12-15 LAB — BASIC METABOLIC PANEL
BUN: 12 mg/dL (ref 6–23)
Chloride: 97 mEq/L (ref 96–112)
GFR calc Af Amer: >90 mL/min (ref >90)
GFR calc non Af Amer: >90 mL/min (ref >90)
Potassium: 3.7 mEq/L (ref 3.5–5.1)

## 2010-12-15 LAB — CARDIAC PANEL(CRET KIN+CKTOT+MB+TROPI)
CK, MB: 6.9 ng/mL (ref 0.3–4.0)
Relative Index: 2 (ref 0.0–2.5)
Relative Index: 2 (ref 0.0–2.5)
Relative Index: 1.9 (ref 0.0–2.5)
Total CK: 414 U/L — ABNORMAL HIGH (ref 7–232)
Total CK: 368 U/L — ABNORMAL HIGH (ref 7–232)
Troponin I: <0.3 ng/mL (ref <0.30)
Troponin I: <0.3 ng/mL (ref <0.30)
Troponin I: <0.3 ng/mL (ref <0.30)

## 2010-12-15 LAB — MAGNESIUM: Magnesium: 1.8 mg/dL (ref 1.5–2.5)

## 2010-12-15 LAB — CBC
MCHC: 35.1 g/dL (ref 30.0–36.0)
Platelets: 337 10*3/uL (ref 150–400)
RDW: 12.8 % (ref 11.5–15.5)
WBC: 9.1 10*3/uL (ref 4.0–10.5)

## 2010-12-15 LAB — PROTIME-INR: Prothrombin Time: 13.3 seconds (ref 11.6–15.2)

## 2010-12-15 MED ORDER — NITROGLYCERIN 0.4 MG SL SUBL
0.4000 mg | SUBLINGUAL_TABLET | Freq: Once | SUBLINGUAL | Status: DC
Start: 2010-12-15 — End: 2010-12-16
  Filled 2010-12-15: qty 75

## 2010-12-15 MED ORDER — HEPARIN BOLUS VIA INFUSION
4000.0000 [IU] | Freq: Once | INTRAVENOUS | Status: AC
  Administered 2010-12-15: 4000 [IU] via INTRAVENOUS
  Filled 2010-12-15: qty 4000

## 2010-12-15 MED ORDER — ZOLPIDEM TARTRATE 5 MG PO TABS
10.0000 mg | ORAL_TABLET | Freq: Every evening | ORAL | Status: DC | PRN
  Administered 2010-12-15: 10 mg via ORAL
  Filled 2010-12-15: qty 2

## 2010-12-15 MED ORDER — HEPARIN SOD (PORCINE) IN D5W 100 UNIT/ML IV SOLN
1000.0000 [IU]/h | INTRAVENOUS | Status: DC
  Administered 2010-12-15: 1000 [IU]/h via INTRAVENOUS
  Filled 2010-12-15: qty 250

## 2010-12-15 MED ORDER — HYDROCODONE-ACETAMINOPHEN 5-325 MG PO TABS
2.0000 | ORAL_TABLET | Freq: Once | ORAL | Status: DC
  Filled 2010-12-15 (×2): qty 2

## 2010-12-15 MED ORDER — ALPRAZOLAM 0.5 MG PO TABS
0.5000 mg | ORAL_TABLET | Freq: Once | ORAL | Status: DC
  Filled 2010-12-15 (×2): qty 1

## 2010-12-15 MED ORDER — ASPIRIN 325 MG PO TABS
325.0000 mg | ORAL_TABLET | ORAL | Status: AC
  Administered 2010-12-15: 325 mg via ORAL
  Filled 2010-12-15: qty 1

## 2010-12-15 NOTE — ED Notes (Signed)
Medications not given due to pt not having any pain at this time.

## 2010-12-15 NOTE — H&P (Signed)
Kenneth C. Hilty, MD Attending Cardiologist [facility]  

## 2010-12-15 NOTE — ED Provider Notes (Signed)
History     CSN: [id number] Arrival date & time: 12/15/2010  1:05 PM   First MD Initiated Contact with Patient 12/15/10 1504      Chief Complaint  Patient presents with  . Chest Pain  pt c/o chest tightness and pinching sensation in chest since this morning. Constant. Notes had started feeling nauseated this am, became 'anxious and jittery' about symptoms, felt as if was having 'panic attack'.  Then noted chest tightness. No change w activity or exertion. No positional change. No assoc diaphoresis or sob. No cough or uri c/o. No hx gerd. Denies hx gallstones/pud. No abd pain. No fever or chills. No pleuritic pain or leg pain or swelling. No hx cad. No hx dvt or pe. No hx cad in parents, states grandparents w heart disease. +smoker. Hx htn, mildly elev cholesterol. States prior stress test normal. Hx anxiety/panic attacks, symptoms similar, but says no recent episodes and no particular stress today, was golfing.  No other recent cp or discomfort of any sort.   (Consider location/radiation/quality/duration/timing/severity/associated sxs/prior treatment) Patient is a 55 y.o. male presenting with chest pain.  Chest Pain Pertinent negatives for primary symptoms include no fever, no shortness of breath, no palpitations and no abdominal pain.     Past Medical History  Diagnosis Date  . Depression   . Hyperlipidemia   . Hypertension     History reviewed. No pertinent past surgical history.  Family History  Problem Relation Age of Onset  . Depression Brother   . Coronary artery disease Maternal Grandfather   . Coronary artery disease Paternal Grandfather   . Prostate cancer Neg Hx   . Colon cancer Neg Hx   . Lung cancer Neg Hx     History  Substance Use Topics  . Smoking status: Current Everyday Smoker -- 0.2 packs/day    Types: Cigarettes  . Smokeless tobacco: Current User  . Alcohol Use: No      Review of Systems  Constitutional: Negative for fever.  HENT: Negative for  neck pain.   Eyes: Negative for redness.  Respiratory: Negative for shortness of breath.   Cardiovascular: Positive for chest pain. Negative for palpitations and leg swelling.  Gastrointestinal: Negative for abdominal pain.  Genitourinary: Negative for flank pain.  Musculoskeletal: Negative for back pain.  Skin: Negative for rash.  Neurological: Negative for headaches.  Hematological: Does not bruise/bleed easily.  Psychiatric/Behavioral: Negative for confusion.    Allergies  Review of patient's allergies indicates no known allergies.  Home Medications   Current Outpatient Rx  Name Route Sig Dispense Refill  . FLUOXETINE HCL 20 MG PO CAPS Oral Take 20 mg by mouth daily. Take in the morning     . LISINOPRIL-HYDROCHLOROTHIAZIDE 20-25 MG PO TABS Oral Take 1 tablet by mouth daily.        BP 150/85  Pulse 111  Temp(Src) 97.5 F (36.4 C) (Oral)  Wt 190 lb (86.183 kg)  SpO2 100%  Physical Exam  Nursing note and vitals reviewed. Constitutional: He is oriented to person, place, and time. He appears well-developed and well-nourished. No distress.  HENT:  Head: Atraumatic.  Eyes: Pupils are equal, round, and reactive to light.  Neck: Neck supple. No tracheal deviation present.  Cardiovascular: Normal rate, regular rhythm, normal heart sounds and intact distal pulses.  Exam reveals no gallop and no friction rub.   No murmur heard. Pulmonary/Chest: Effort normal and breath sounds normal. No accessory muscle usage. No respiratory distress. He has no rales.  Abdominal:  Soft. Bowel sounds are normal. He exhibits no distension. There is no tenderness.  Musculoskeletal: Normal range of motion. He exhibits no edema and no tenderness.  Neurological: He is alert and oriented to person, place, and time.  Skin: Skin is warm and dry.  Psychiatric:       Mildly anxious.     ED Course  Procedures (including critical care time)   Labs Reviewed  CBC  BASIC METABOLIC PANEL  CARDIAC  PANEL(CRET KIN+CKTOT+MB+TROPI)   Results for orders placed during the hospital encounter of 12/15/10  CBC      Component Value Range   WBC 9.1  4.0 - 10.5 (K/uL)   RBC 4.87  4.22 - 5.81 (MIL/uL)   Hemoglobin 15.6  13.0 - 17.0 (g/dL)   HCT 91.4  78.2 - 95.6 (%)   MCV 91.4  78.0 - 100.0 (fL)   MCH 32.0  26.0 - 34.0 (pg)   MCHC 35.1  30.0 - 36.0 (g/dL)   RDW 21.3  08.6 - 57.8 (%)   Platelets 337  150 - 400 (K/uL)  BASIC METABOLIC PANEL      Component Value Range   Sodium 137  135 - 145 (mEq/L)   Potassium 3.7  3.5 - 5.1 (mEq/L)   Chloride 97  96 - 112 (mEq/L)   CO2 28  19 - 32 (mEq/L)   Glucose, Bld 109 (*) 70 - 99 (mg/dL)   BUN 12  6 - 23 (mg/dL)   Creatinine, Ser 4.69  0.50 - 1.35 (mg/dL)   Calcium 62.9 (*) 8.4 - 10.5 (mg/dL)   GFR calc non Af Amer >90  >90 (mL/min)   GFR calc Af Amer >90  >90 (mL/min)  CARDIAC PANEL(CRET KIN+CKTOT+MB+TROPI)      Component Value Range   Total CK 414 (*) 7 - 232 (U/L)   CK, MB 8.4 (*) 0.3 - 4.0 (ng/mL)   Troponin I <0.30  <0.30 (ng/mL)   Relative Index 2.0  0.0 - 2.5    Dg Chest 2 View  12/15/2010  *RADIOLOGY REPORT*  Clinical Data: Chest tightness and SOB  CHEST - 2 VIEW  Comparison: 07/01/2009  Findings:  Heart size is normal.  No pleural effusion or pulmonary edema.  No airspace consolidation identified.  The review of the visualized osseous structures is negative.  IMPRESSION:  1.  No active cardiopulmonary abnormalities.  Original Report Authenticated By: Rosealee Albee, M.D.       MDM  Iv ns. Labs. Ecg. Asa.     Date: 12/15/2010  Rate: 105  Rhythm: sinus tachycardia  QRS Axis: left  Intervals: normal  ST/T Wave abnormalities: normal  Conduction Disutrbances:none  Narrative Interpretation:   Old EKG Reviewed: none available  Asa, ntg. vicodin and xanax for pain/anxiety.   Recheck pt feels improved.   Discussed w dr Wylene Simmer - requests cardiology be called.  Discussed w Dr Herbie Baltimore - will see in ed.admit.    Suzi Roots, MD 12/15/10 [phone]

## 2010-12-15 NOTE — Progress Notes (Signed)
ANTICOAGULATION CONSULT NOTE - Initial Consult  Pharmacy Consult for Heparin Indication: chest pain/ACS  No Known Allergies  Patient Measurements: Height: 5' 8.9" (175 cm) Weight: 190 lb (86.183 kg) IBW/kg (Calculated) : 70.47   Vital Signs: Temp: 98.4 F (36.9 C) (12/03 1607) Temp src: Oral (12/03 1607) BP: 133/87 mmHg (12/03 1607) Pulse Rate: 111  (12/03 1314)  Labs:  Basename 12/15/10 1812 12/15/10 1450  HGB -- 15.6  HCT -- 44.5  PLT -- 337  APTT -- --  LABPROT -- --  INR -- --  HEPARINUNFRC -- --  CREATININE -- 0.74  CKTOTAL 351* 414*  CKMB 7.0* 8.4*  TROPONINI <0.30 <0.30   Estimated Creatinine Clearance: 114.7 ml/min (by C-G formula based on Cr of 0.74).  Medical History: Past Medical History  Diagnosis Date  . Depression   . Hyperlipidemia   . Hypertension   . Tobacco abuse 12/15/2010  . Hypercholesteremia 12/15/2010  . Hard of hearing 12/15/2010    Medications:   (Not in a hospital admission)  Assessment: 55 y/o male patient admitted with chest pain requiring anticoagulation for r/o MI. CE negative and EKG wnl.  Goal of Therapy:  Heparin level 0.3-0.7 units/ml   Plan:  Heparin 4000 unit IV bolus followed by infusion at 1000 units/hr, check 6 hour heparin level. Daily cbc and heparin level.  Verlene Mayer, PharmD, BCPS Pager [phone] 12/15/2010,7:56 PM

## 2010-12-15 NOTE — ED Notes (Signed)
PT NOW STATING "i CAN FEEL IT IN MY LEFT SHOULDER BLADE"

## 2010-12-15 NOTE — H&P (Addendum)
This 55 year old male has atypical chest pain that started while playing golf. Initially he thought was a panic attack because he felt his heart beating faster he became slightly knowledges with this but never had any diaphoresis or vomiting. With the entire event lasting greater then 3 hours he came to the emergency room for evaluation. EKG shows no acute changes and there are no ST-T wave abnormalities suggest ischemia his initial troponin was low and this CK however was elevated at 438 with 8 MB units.  Last week he had done strenuous activity that include hunting  Dragging deer and wild boar out of the woods and carrying heavy bags of corn. He had muscle soreness is results of this and this may very well be the explanation for the elevated total CK.    His cardiovascular risk factors include a strong family history with both grandparents dying in their 59s from myocardial infarction. History of hypertension and a history of cigarette abuse.  His vital signs were stable his examination was unremarkable in particular no murmur clear lungs no edema he does wear bilateral hearing aids.  Assessment #1 atypical chest pain with multiple risk factors with an abnormal CK total but relative low MB units.  Plan the patient will be admitted to telemetry serial enzymes will be obtained we'll place him on IV heparin and plan for a diagnostic cardiac catheterization tomorrow. In particular we discussed the risks of bleeding and bruising.   For the remainder of the complete H&P please see dictated note Ms. [facility]

## 2010-12-15 NOTE — ED Notes (Signed)
Pt states "it started before I got on the golf course but I thought it was anxiety, I feel jittery"; pt is warm & dry, oxygen provided @ 2L/[place], placed on telemetry, EKG completed

## 2010-12-15 NOTE — H&P (Signed)
George Graham is an 55 y.o. male.   Chief Complaint:chest pain   HPI: 55 year old white single male presents to the ER with chest pain today. Dr. Wylene Simmer, his primary care physician, had asked [place] to evaluate him.  He has no known coronary artery disease. He has had a stress test, the last one being over 6 years ago. He has been in a good state of health without any complications.  Today he was playing golf he did not feel well, he did not play well, he played the first nine holes with intermittent chest discomfort chest discomfort and nausea that would come and go. He did not feel like eating lunch, tried to rest and the chest discomfort would return.  They continued without stopping along with the nausea and he presented to the emergency room. Here the nasal oxygen and rest and the chest pains completely resolved.   His risk factors include hypertension, dyslipidemia, tobacco use and strong family history with both grandfathers dying of heart attacks at 58.    Last week he had a very strenuous week with a hunting trip pulled 6 deer out of the woods up a hill and 2 wild boar without chest pain.  Past Medical History  Diagnosis Date  . Depression   . Hyperlipidemia   . Hypertension   . Tobacco abuse 12/15/2010  . Hypercholesteremia 12/15/2010  . Hard of hearing 12/15/2010    History reviewed. No pertinent past surgical history.  Family History  Problem Relation Age of Onset  . Depression Brother   . Coronary artery disease Maternal Grandfather   . Coronary artery disease Paternal Grandfather   . Prostate cancer Neg Hx   . Colon cancer Neg Hx   . Lung cancer Neg Hx   . Atrial fibrillation Mother   . Cancer Father    Social History:  reports that he has been smoking Cigarettes.  He has been smoking about .2 packs per day. He has never used smokeless tobacco. He reports that he does not drink alcohol or use illicit drugs.  He does drink at least a pot of caffeinated coffee a day. He is  single and has one daughter. He is to [employer] fund[employer].  Allergies: No Known Allergies  Medications Prior to Admission  Medication Dose Route Frequency Provider Last Rate Last Dose  . ALPRAZolam Prudy Feeler) tablet 0.5 mg  0.5 mg Oral Once Suzi Roots, MD      . aspirin tablet 325 mg  325 mg Oral STAT Suzi Roots, MD   325 mg at 12/15/10 1441  . heparin 100 units/mL bolus via infusion 4,000 Units  4,000 Units Intravenous Once Christian M Buettner, PHARMD      . heparin ADULT infusion 100 units/ml (25000 units/250 ml)  1,000 Units/hr Intravenous Continuous Christian M Buettner, PHARMD      . HYDROcodone-acetaminophen (NORCO) 5-325 MG per tablet 2 tablet  2 tablet Oral Once Suzi Roots, MD      . nitroGLYCERIN (NITROSTAT) SL tablet 0.4 mg  0.4 mg Sublingual Once Suzi Roots, MD       Medications Prior to Admission  Medication Sig Dispense Refill  . FLUoxetine (PROZAC) 20 MG capsule Take 20 mg by mouth daily. Take in the morning       . lisinopril-hydrochlorothiazide (PRINZIDE,ZESTORETIC) 20-25 MG per tablet Take 1 tablet by mouth daily.          Results for orders placed during the hospital encounter of 12/15/10 (from the past  48 hour(s))  CBC     Status: Normal   Collection Time   12/15/10  2:50 PM      Component Value Range Comment   WBC 9.1  4.0 - 10.5 (K/uL)    RBC 4.87  4.22 - 5.81 (MIL/uL)    Hemoglobin 15.6  13.0 - 17.0 (g/dL)    HCT 16.1  09.6 - 04.5 (%)    MCV 91.4  78.0 - 100.0 (fL)    MCH 32.0  26.0 - 34.0 (pg)    MCHC 35.1  30.0 - 36.0 (g/dL)    RDW 40.9  81.1 - 91.4 (%)    Platelets 337  150 - 400 (K/uL)   BASIC METABOLIC PANEL     Status: Abnormal   Collection Time   12/15/10  2:50 PM      Component Value Range Comment   Sodium 137  135 - 145 (mEq/L)    Potassium 3.7  3.5 - 5.1 (mEq/L)    Chloride 97  96 - 112 (mEq/L)    CO2 28  19 - 32 (mEq/L)    Glucose, Bld 109 (*) 70 - 99 (mg/dL)    BUN 12  6 - 23 (mg/dL)    Creatinine, Ser 7.82  0.50 - 1.35 (mg/dL)    Calcium  95.6 (*) 8.4 - 10.5 (mg/dL)    GFR calc non Af Amer >90  >90 (mL/min)    GFR calc Af Amer >90  >90 (mL/min)   CARDIAC PANEL(CRET KIN+CKTOT+MB+TROPI)     Status: Abnormal   Collection Time   12/15/10  2:50 PM      Component Value Range Comment   Total CK 414 (*) 7 - 232 (U/L)    CK, MB 8.4 (*) 0.3 - 4.0 (ng/mL)    Troponin I <0.30  <0.30 (ng/mL)    Relative Index 2.0  0.0 - 2.5    CARDIAC PANEL(CRET KIN+CKTOT+MB+TROPI)     Status: Abnormal   Collection Time   12/15/10  6:12 PM      Component Value Range Comment   Total CK 351 (*) 7 - 232 (U/L)    CK, MB 7.0 (*) 0.3 - 4.0 (ng/mL) CRITICAL VALUE NOTED.  VALUE IS CONSISTENT WITH PREVIOUSLY REPORTED AND CALLED VALUE.   Troponin I <0.30  <0.30 (ng/mL)    Relative Index 2.0  0.0 - 2.5     Dg Chest 2 View  12/15/2010  *RADIOLOGY REPORT*  Clinical Data: Chest tightness and SOB  CHEST - 2 VIEW  Comparison: 07/01/2009  Findings:  Heart size is normal.  No pleural effusion or pulmonary edema.  No airspace consolidation identified.  The review of the visualized osseous structures is negative.  IMPRESSION:  1.  No active cardiopulmonary abnormalities.  Original Report Authenticated By: Rosealee Albee, M.D.    ROSDicky Doe.: No colds or fevers recently Skin: No rashes or ulcers. HEENT: No blurred vision or double vision. Cardiovascular: No chest pain until today no palpitations. Pulmonary: Positive tobacco use. No complaints of shortness of breath. GI: No diarrhea constipation or melena no history of GI bleeding. GU: No hematuria or dysuria. Musculoskeletal: No leg pain. Neuro: No syncope, lightheadedness or dizziness. But today had mild lightheadedness with his other symptoms. 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 He has known elevated cholesterol but has few statins as he is concerned about his liver.   Blood pressure 142/78, pulse 82, temperature 98.1 F (36.7  C), temperature source Oral, resp. rate  20, height 5' 8.9" (1.75 m), weight 86.183 kg (190 lb), SpO2 100.00%. PE: General: Alert oriented white male in no acute distress pleasant affect. Skin: Warm and dry brisk capillary refill. HEENT: Normocephalic sclera clear. Neck: Supple no JVD. No carotid bruits. Lungs: Clear without rales rhonchi or wheezes. Heart: S1-S2 regular rate and rhythm without murmur gallop rub or click. Abdomen soft nontender positive bowel sounds do not palpate liver spleen or masses. Extremities: no leg pain to palpation pedal pulses are present no lower extremity edema. Neuro: Alert oriented x3 moves all extremities follows commands.    Assessment/Plan Patient Active Problem List  Diagnoses  . Other and Unspecified Hyperlipidemia  . DEPRESSION  . HYPERTENSION  . NEVUS, NON-NEOPLASTIC  . PRURITUS  . THUMB PAIN, RIGHT  . Chest pain at rest  . Tobacco abuse  . Hypercholesteremia  . Hard of hearing   Plan: Dr. Clarene Duke has seen and examined the patient. We'll admit to telemetry bed. We'll begin IV heparin. We'll add low-dose statin tonight. Will do serial cardiac enzymes. Continue home medications. Due to the patient's strong family history of coronary disease Dr. Clarene Duke plans to have the patient undergo cardiac catheterization 12/16/2010. He discussed this in detail with the patient who agrees to proceed with a cardiac catheterization. We also with the patient's permission discussed it with his brother Roseanne Reno and the patient's girlfriend,Betty.  Kreig Parson R 12/15/2010, 8:11 PM

## 2010-12-16 ENCOUNTER — Encounter (HOSPITAL_COMMUNITY)
Admission: EM | Disposition: A | Discharge status: Home / Self Care | Payer: Self-pay | Source: Home / Self Care | Attending: Emergency Medicine

## 2010-12-16 ENCOUNTER — Encounter (HOSPITAL_COMMUNITY): Payer: Self-pay | Admitting: Internal Medicine

## 2010-12-16 ENCOUNTER — Other Ambulatory Visit: Payer: Self-pay

## 2010-12-16 HISTORY — PX: LEFT HEART CATHETERIZATION WITH CORONARY ANGIOGRAM: SHX5451

## 2010-12-16 LAB — CARDIAC PANEL(CRET KIN+CKTOT+MB+TROPI)
CK, MB: 6.4 ng/mL (ref 0.3–4.0)
Relative Index: 2.1 (ref 0.0–2.5)
Total CK: 322 U/L — ABNORMAL HIGH (ref 7–232)
Total CK: 313 U/L — ABNORMAL HIGH (ref 7–232)

## 2010-12-16 LAB — HEPATIC FUNCTION PANEL
ALT: 21 U/L (ref 0–53)
Bilirubin, Direct: <0.1 mg/dL (ref 0.0–0.3)
Total Protein: 7.1 g/dL (ref 6.0–8.3)

## 2010-12-16 LAB — BASIC METABOLIC PANEL
BUN: 18 mg/dL (ref 6–23)
Calcium: 9.9 mg/dL (ref 8.4–10.5)
Chloride: 102 mEq/L (ref 96–112)
Creatinine, Ser: 0.81 mg/dL (ref 0.50–1.35)
GFR calc Af Amer: >90 mL/min (ref >90)

## 2010-12-16 LAB — HEPARIN LEVEL (UNFRACTIONATED): Heparin Unfractionated: 0.43 IU/mL (ref 0.30–0.70)

## 2010-12-16 LAB — CBC
HCT: 43 % (ref 39.0–52.0)
MCH: 31.9 pg (ref 26.0–34.0)
MCH: 31.2 pg (ref 26.0–34.0)
MCHC: 34.4 g/dL (ref 30.0–36.0)
MCV: 92.7 fL (ref 78.0–100.0)
MCV: 93.9 fL (ref 78.0–100.0)
Platelets: 308 10*3/uL (ref 150–400)
Platelets: 279 10*3/uL (ref 150–400)
RBC: 4.26 MIL/uL (ref 4.22–5.81)
RDW: 13.1 % (ref 11.5–15.5)
RDW: 13.3 % (ref 11.5–15.5)
WBC: 6.5 10*3/uL (ref 4.0–10.5)

## 2010-12-16 LAB — PROTIME-INR: Prothrombin Time: 12.9 seconds (ref 11.6–15.2)

## 2010-12-16 LAB — LIPID PANEL
Cholesterol: 244 mg/dL — ABNORMAL HIGH (ref 0–200)
HDL: 45 mg/dL (ref >39)
Total CHOL/HDL Ratio: 5.4 RATIO
Triglycerides: 137 mg/dL (ref <150)

## 2010-12-16 SURGERY — LEFT HEART CATHETERIZATION WITH CORONARY ANGIOGRAM
Anesthesia: LOCAL

## 2010-12-16 MED ORDER — ONDANSETRON HCL 4 MG/2ML IJ SOLN: 4.0000 mg | Freq: Four times a day (QID) | INTRAMUSCULAR | Status: DC | PRN

## 2010-12-16 MED ORDER — SODIUM CHLORIDE 0.9 % IV SOLN: 250.0000 mL | INTRAVENOUS | Status: DC | PRN

## 2010-12-16 MED ORDER — SODIUM CHLORIDE 0.9 % IV SOLN
1.0000 mL/kg/h | INTRAVENOUS | Status: DC
  Administered 2010-12-16: 1 mL/kg/h via INTRAVENOUS

## 2010-12-16 MED ORDER — ASPIRIN 81 MG PO CHEW
324.0000 mg | CHEWABLE_TABLET | ORAL | Status: AC
  Administered 2010-12-16: 324 mg via ORAL
  Filled 2010-12-16: qty 1

## 2010-12-16 MED ORDER — HEPARIN SODIUM (PORCINE) 1000 UNIT/ML IJ SOLN
INTRAMUSCULAR | Status: AC
  Filled 2010-12-16: qty 1

## 2010-12-16 MED ORDER — METOPROLOL TARTRATE 25 MG PO TABS: 12.5000 mg | ORAL_TABLET | Freq: Two times a day (BID) | ORAL | Status: DC

## 2010-12-16 MED ORDER — VERAPAMIL HCL 2.5 MG/ML IV SOLN
INTRAVENOUS | Status: AC
  Filled 2010-12-16: qty 2

## 2010-12-16 MED ORDER — ROSUVASTATIN CALCIUM 5 MG PO TABS
5.0000 mg | ORAL_TABLET | Freq: Every day | ORAL | Status: DC
  Filled 2010-12-16: qty 1

## 2010-12-16 MED ORDER — SODIUM CHLORIDE 0.9 % IJ SOLN: 3.0000 mL | INTRAMUSCULAR | Status: DC | PRN

## 2010-12-16 MED ORDER — ACETAMINOPHEN 325 MG PO TABS: 650.0000 mg | ORAL_TABLET | ORAL | Status: DC | PRN

## 2010-12-16 MED ORDER — DIAZEPAM 5 MG PO TABS: 5.0000 mg | ORAL_TABLET | ORAL | Status: DC

## 2010-12-16 MED ORDER — ROSUVASTATIN CALCIUM 5 MG PO TABS: 5.0000 mg | ORAL_TABLET | Freq: Every day | ORAL | Status: DC

## 2010-12-16 MED ORDER — FENTANYL CITRATE 0.05 MG/ML IJ SOLN
INTRAMUSCULAR | Status: AC
  Filled 2010-12-16: qty 2

## 2010-12-16 MED ORDER — SODIUM CHLORIDE 0.9 % IV SOLN: 1.0000 mL/kg/h | INTRAVENOUS | Status: AC

## 2010-12-16 MED ORDER — FLUOXETINE HCL 20 MG PO CAPS
20.0000 mg | ORAL_CAPSULE | Freq: Every day | ORAL | Status: DC
  Filled 2010-12-16: qty 1

## 2010-12-16 MED ORDER — MIDAZOLAM HCL 2 MG/2ML IJ SOLN
INTRAMUSCULAR | Status: AC
  Filled 2010-12-16: qty 2

## 2010-12-16 MED ORDER — ALPRAZOLAM 0.25 MG PO TABS: 0.2500 mg | ORAL_TABLET | Freq: Two times a day (BID) | ORAL | Status: DC | PRN

## 2010-12-16 MED ORDER — LISINOPRIL 20 MG PO TABS
20.0000 mg | ORAL_TABLET | Freq: Every day | ORAL | Status: DC
  Filled 2010-12-16: qty 1

## 2010-12-16 MED ORDER — SODIUM CHLORIDE 0.9 % IJ SOLN: 3.0000 mL | Freq: Two times a day (BID) | INTRAMUSCULAR | Status: DC

## 2010-12-16 MED ORDER — HYDROCHLOROTHIAZIDE 25 MG PO TABS
25.0000 mg | ORAL_TABLET | Freq: Every day | ORAL | Status: DC
  Filled 2010-12-16: qty 1

## 2010-12-16 MED ORDER — ASPIRIN EC 81 MG PO TBEC
81.0000 mg | DELAYED_RELEASE_TABLET | Freq: Every day | ORAL | Status: DC
  Filled 2010-12-16: qty 1

## 2010-12-16 MED ORDER — NITROGLYCERIN 0.4 MG SL SUBL: 0.4000 mg | SUBLINGUAL_TABLET | SUBLINGUAL | Status: DC | PRN

## 2010-12-16 MED ORDER — LISINOPRIL-HYDROCHLOROTHIAZIDE 20-25 MG PO TABS: 1.0000 | ORAL_TABLET | Freq: Every day | ORAL | Status: DC

## 2010-12-16 NOTE — ED Notes (Signed)
Call received from [facility], will be coming for patient, pt notified of plans, heparin stopped, pt notified family

## 2010-12-16 NOTE — Progress Notes (Signed)
ANTICOAGULATION CONSULT NOTE - Follow Up Consult  Pharmacy Consult for heparin Indication: chest pain/ACS  No Known Allergies  Patient Measurements: Height: 5' 8.9" (175 cm) Weight: 190 lb (86.183 kg) IBW/kg (Calculated) : 70.47  Adjusted Body Weight:   Vital Signs: Temp: 97.5 F (36.4 C) (12/04 0002) Temp src: Oral (12/04 0002) BP: 105/66 mmHg (12/04 0002) Pulse Rate: 81  (12/04 0002)  Labs:  Basename 12/16/10 0156 12/16/10 0010 12/15/10 2000 12/15/10 1812 12/15/10 1450  HGB 13.6 -- -- -- 15.6  HCT 39.5 -- -- -- 44.5  PLT 308 -- -- -- 337  APTT -- -- -- -- --  LABPROT -- -- 13.3 -- --  INR -- -- 0.99 -- --  HEPARINUNFRC 0.43 -- -- -- --  CREATININE -- -- -- -- 0.74  CKTOTAL -- 322* 368* 351* --  CKMB -- 6.4* 6.9* 7.0* --  TROPONINI -- <0.30 <0.30 <0.30 --   Estimated Creatinine Clearance: 114.7 ml/min (by C-G formula based on Cr of 0.74).     Assessment: Patient with 1st heparin level at goal.  No issues with drip. Goal of Therapy:  Heparin level 0.3-0.7 units/ml   Plan:  Continue heparin at current rate, f/u with 2nd level  George Graham, George Graham 12/16/2010,4:14 AM

## 2010-12-16 NOTE — Interval H&P Note (Signed)
History and Physical Interval Note:  12/16/2010 10:54 AM  George Graham  has presented today for surgery, with the diagnosis of chest pain  The various methods of treatment have been discussed with the patient and family. After consideration of risks, benefits and other options for treatment, the patient has consented to  Procedure(s): LEFT HEART CATHETERIZATION WITH CORONARY ANGIOGRAM as a surgical intervention .  The patients' history has been reviewed, patient examined, no change in status, stable for surgery.  I have reviewed the patients' chart and labs.  Questions were answered to the patient's satisfaction.     Ridhi Hoffert C

## 2010-12-16 NOTE — ED Notes (Signed)
PT transferred to [facility] via carelink in stable condition

## 2010-12-16 NOTE — Op Note (Signed)
[facility]     CARDIAC CATHETERIZATION REPORT  George Graham   [id number] [birth year]  Performing Cardiologist: Chrystie Nose Primary Physician: Melida Quitter, MD Primary Cardiologist:  No cardiologist  Procedures Performed:  Left Heart Catheterization via 5 Fr right radial access  Left Ventriculography, (RAO/LAO) 15 ml/sec for a 30 ml total contrast  Native Coronary Angiography  Indication(s): Chest pain  History: 55 y.o. male with a history of anxiety and hypertension as well as a family history of coronary artery disease. He had acute onset episode of chest pain with mild shortness of breath and nausea. He had no obvious EKG changes concerning for ischemia and cardiac enzymes are negative. He is referred for left heart catheterization.  Consent: The procedure with Risks/Benefits/Alternatives and Indications was reviewed with the patient.  All questions were answered.    Risks / Complications include, but not limited to: Death, MI, CVA/TIA, VF/VT (with defibrillation), Bradycardia (need for temporary pacer placement), contrast induced nephropathy, bleeding / bruising / hematoma / pseudoaneurysm, vascular or coronary injury (with possible emergent CT or Vascular Surgery), adverse medication reactions, infection.    The patient voices understanding and agree to proceed.    Risks of procedure as well as the alternatives and risks of each were explained to the (patient/caregiver).  Consent for procedure obtained. Consent for signed by MD and patient with RN witness -- placed on chart.  Procedure: The patient was brought to the 2nd Floor [place] [facility] in the fasting state and prepped and draped in the usual sterile fashion for Right groin or radial access. A modified Allen's test with plethysmography was performed on the right wrist demonstrating adequate Ulnar Artery collateral flow).    Sterile technique was used including  antiseptics, cap, gloves, gown, hand hygiene, mask and sheet.  Skin prep: Chlorhexidine;  Time Out: Verified patient identification, verified procedure, site/side was marked, verified correct patient position, special equipment/implants available, medications/allergies/relevent history reviewed, required imaging and test results available.  Performed  The right wrist was anesthetized with 1% subcutaneous Lidocaine.  The right radial artery was accessed using the Seldinger Technique with placement of a 5 Fr Glide Sheath. The sheath was aspirated and flushed.  Then a total of 3 ml of standard Radial Artery Cocktail (see medications) was infused.  @KCHRADCOCKTAIL @  A 5 Fr TIG 4.0 Catheter was advanced of over a Safety J wire into the ascending Aorta.  The catheter was used to engage the left and right coronary artery.  Multiple cineangiographic views of the left and right coronary artery system(s) were performed. This catheter was then exchanged over the Long Exchange Safety J wire for an angled Pigtail catheter that was advanced across the Aortic Valve.  LV hemodynamics were measured and Left Ventriculography was performed.  LV hemodynamics were then re-sampled, and the catheter was pulled back across the Aortic Valve for measurement of "pull-back" gradient.  The catheter and the wire was removed completely out of the body.  The sheath was removed in the Cath Lab with a TR band placed at 11 ml Air at 14:21 (time).  Reverse Allen's test revealed non-occlusive hemostasis.  The patient was transported to the Holding area in stable condition.   The patient  was stable before, during and following the procedure.   Patient did tolerate procedure well. There were not complications. EBL: 10 cc  Medications:  Premedication: 5 mg  Valium  Sedation:  2 mg IV Versed, 50 IV mcg Fentanyl  Contrast:  70 Omnipaque   Hemodynamics:  Central Aortic Pressure / Mean Aortic Pressure: 100/64  LV Pressure / LV End  diastolic Pressure:  12  Left Ventriculography:  EF:  60%  Wall Motion: no wall motion abnormalities  Coronary Angiographic Data:  1.  Angiographically normal coronary arteries of large caliber.  2.  Co-dominant circulation.  Impression: 1.  No cardiac findings to explain chest pain. This may have been related to anxiety.  Plan: 1.  Discharge home today. Follow-up with me in 2-3 weeks.  2.  Follow-up with PCP as well.  The case and results was discussed with the patient and family. The case and results was not discussed with the patient's PCP. The case and results was discussed with the patient's Cardiologist.  Time Spend Directly with Patient:  45 minutes  Chrystie Nose, MD Attending Cardiologist [facility]   HILTY,Kenneth C 12/16/2010, 2:44 PM

## 2010-12-16 NOTE — ED Notes (Signed)
Awaiting transport to [facility]

## 2010-12-18 NOTE — Discharge Summary (Signed)
Physician Discharge Summary  Patient ID: George Graham MRN: [id number] DOB/AGE: 55-Jul-1957 55 y.o.  Admit date: 12/15/2010 Discharge date: 12/16/10  Discharge Diagnoses:  Principal Problem:  *Chest pain at rest Active Problems:  Tobacco abuse  Hypercholesteremia  Hard of hearing   Discharged Condition: good  Hospital Course: 55 year old white male was seen in the emergency room at [facility] 12/15/2010 after experiencing chest pain while playing golf. He had no known coronary artery disease, a negative stress test over 6 years ago, with strong family history with 2 grandfathers died of heart attacks at 66 and he himself having risk factors of hypertension, dyslipidemia, nontreated at his request, and tobacco use.  The week prior to admission, he had been hunting, pulling 6 dear uphill as well as 2 wild pigs. No chest pain despite the strenuous activity.  On day of admission while playing golf he felt nauseated chest discomfort that would come and go by the time he played the first nine holes, this discomfort had increased. He thought it might be an anxiety attack but he did not feel anxious. Had been many years since he had an anxiety attack.  The nausea increased the discomfort increased and he came to the emergency room.  Cardiac enzymes were negative but with patient's risk factors strong family history an active lifestyle with hunting in the woods Dr. Clarene Duke saw and evaluated him and thought he should be admitted and undergo cardiac catheterization.  Patient did well overnight with continued negative cardiac enzymes he was placed on IV heparin next morning underwent cardiac catheterization which revealed normal coronary arteries and an ejection fraction of 60%. Per the cath report angiographically normal coronary arteries of large caliber.   The procedure was done to the right wrist.  Patient had been transferred from [facility] to [facility] for cardiac  catheterization and  from the Cath Lab he went to short stay at [facility] and was discharged from there.  There were no complications.  He will followup with Dr. Wylene Simmer  and we will also arrange for him to follow a Dr. Rennis Golden. The chest pain may have been anxiety versus gastroesophageal. Nevertheless the pain was gone if it returns we recommend followup with Dr. Wylene Simmer.  Consults: none  Significant Diagnostic Studies:  Laboratory data: CKs were elevated at 4:15, 351, 368, 313. Dr. Clarene Duke thought this was related to pulling the deer out of the woods the week prior. MBs were not cardiac significant ranging 8.4 6.4. Troponin I's were all negative at less than 0.30.  Total cholesterol 244 triglycerides 137 HDL 45 and LDL 172. Please be aware the patient needed his cholesterol would be elevated he has refused statins in the past secondary to concern for liver function issues. He was discharged with a prescription for Crestor with the instructions he could wait and discuss with Dr. Wylene Simmer before he started the medication.  Chemistry sodium 138 potassium 4.7 chloride 102 CO2 26 BUN 18 creatinine was 0.81 calcium 9.9 glucose 125 alkaline phosphatase 8 albumin 3.9 AST 29 ALT 21 total protein 7.1  Hemoglobin 14.3 hematocrit 43 WBC 6.5 and platelets 279.  Hemoglobin A1c was 6.3   TSH was 0.635 Two-view chest x-ray with no acute cardiopulmonary process. EKG sinus rhythm with nonspecific ST changes.  Discharge Exam: Blood pressure 133/84, pulse 89, temperature 97.3 F (36.3 C), temperature source Oral, resp. rate 18, height 5' 8.9" (1.75 m), weight 86.183 kg (190 lb), SpO2 97.00%.  patient was stable at time  of discharge without complications. He had been normal exam otherwise.  Disposition: Home or Self Care   Discharge Medication List as of 12/16/2010  5:08 PM    START taking these medications   Details  rosuvastatin (CRESTOR) 5 MG tablet Take 1 tablet (5 mg total) by mouth daily., Starting 12/16/2010,  Until Wed 12/16/11, Print      CONTINUE these medications which have NOT CHANGED   Details  FLUoxetine (PROZAC) 20 MG capsule Take 20 mg by mouth daily. Take in the morning , Until Discontinued, Historical Med    lisinopril-hydrochlorothiazide (PRINZIDE,ZESTORETIC) 20-25 MG per tablet Take 1 tablet by mouth daily.  , Until Discontinued, Historical Med       Follow-up Information    Follow up with Gaspar Garbe, MD in 1 week. (call for appt.)    Contact information:   [address] [facility], [place]. [place] [address] [phone]          Signed: Leone Brand 12/18/2010, 12:47 PM

## 2010-12-19 NOTE — Discharge Summary (Signed)
Kenneth C. Hilty, MD Attending Cardiologist [facility]  

## 2011-05-28 ENCOUNTER — Other Ambulatory Visit: Payer: Self-pay | Admitting: Otolaryngology

## 2011-06-05 ENCOUNTER — Encounter (HOSPITAL_COMMUNITY): Payer: Self-pay | Admitting: Pharmacy Technician

## 2011-06-11 ENCOUNTER — Encounter (HOSPITAL_COMMUNITY): Payer: Self-pay

## 2011-06-11 ENCOUNTER — Encounter (HOSPITAL_COMMUNITY)
Admission: RE | Admit: 2011-06-11 | Discharge: 2011-06-11 | Disposition: A | Discharge status: Home / Self Care | Payer: BC Managed Care – PPO | Source: Ambulatory Visit | Attending: Specialist | Admitting: Specialist

## 2011-06-11 HISTORY — DX: Angina pectoris, unspecified: I20.9

## 2011-06-11 HISTORY — DX: Acute upper respiratory infection, unspecified: J06.9

## 2011-06-11 LAB — CBC
HCT: 43.2 % (ref 39.0–52.0)
MCHC: 33.3 g/dL (ref 30.0–36.0)
MCV: 95.4 fL (ref 78.0–100.0)
RDW: 13.5 % (ref 11.5–15.5)

## 2011-06-11 LAB — BASIC METABOLIC PANEL
BUN: 13 mg/dL (ref 6–23)
Creatinine, Ser: 0.77 mg/dL (ref 0.50–1.35)
GFR calc Af Amer: >90 mL/min (ref >90)
GFR calc non Af Amer: >90 mL/min (ref >90)
Potassium: 4.8 mEq/L (ref 3.5–5.1)

## 2011-06-11 NOTE — Patient Instructions (Addendum)
20 JAGO CARTON  06/11/2011   Your procedure is scheduled on: 06/17/11 surgery  [id number]    [facility]  Report to [facility] at  0630      AM.  Call this number if you have problems the morning of surgery: [phone]     Or PST   [id number]  [facility]   Remember:   Do not eat food  Or drink any :After Midnight.  Tuesday NIGHT    Take these medicines the morning of surgery with A SIP OF WATER: PROZAC                  May take Hydrocodone if needed  Do not wear jewelry, make-up or nail polish.  Do not wear lotions, powders, or perfumes. You may wear deodorant.  Do not shave 48 hours prior to surgery.  Do not bring valuables to the hospital.  Contacts, dentures or bridgework may not be worn into surgery.  Leave suitcase in the car. After surgery it may be brought to your room.  For patients admitted to the hospital, checkout time is 11:00 AM the day of discharge.   Patients discharged the day of surgery will not be allowed to drive home.  Name and phone number of your driver: BETTY                                                                     Special Instructions: CHG Shower Use Special Wash: 1/2 bottle night before surgery and 1/2 bottle morning of surgery. REGULAR SOAP FACE AND PRIVATES                          MEN-MAY SHAVE FACE MORNING OF SURGERY  Please read over the following fact sheets that you were given: MRSA Information

## 2011-06-11 NOTE — Pre-Procedure Instructions (Signed)
Refused social work consult- states has notified office that insurance will not cover inpatient rehab until after 3 days in hospital

## 2011-06-16 NOTE — Anesthesia Preprocedure Evaluation (Signed)
Anesthesia Evaluation  Patient identified by MRN, date of birth, ID band Patient awake    Reviewed: Allergy & Precautions, H&P , NPO status , Patient's Chart, lab work & pertinent test results  Airway Mallampati: II TM Distance: >3 FB Neck ROM: Full    Dental No notable dental hx.    Pulmonary Recent URI ,  breath sounds clear to auscultation  Pulmonary exam normal       Cardiovascular hypertension, Pt. on medications + angina Rhythm:Regular Rate:Normal  Normal coronaries on Dec 2012 cath. EF 60%    Neuro/Psych PSYCHIATRIC DISORDERS Depression negative neurological ROS     GI/Hepatic negative GI ROS, Neg liver ROS,   Endo/Other  negative endocrine ROS  Renal/GU negative Renal ROS  negative genitourinary   Musculoskeletal negative musculoskeletal ROS (+)   Abdominal   Peds negative pediatric ROS (+)  Hematology negative hematology ROS (+)   Anesthesia Other Findings   Reproductive/Obstetrics negative OB ROS                           Anesthesia Physical Anesthesia Plan  ASA: II  Anesthesia Plan: General   Post-op Pain Management:    Induction: Intravenous  Airway Management Planned: Oral ETT  Additional Equipment:   Intra-op Plan:   Post-operative Plan: Extubation in OR  Informed Consent: I have reviewed the patients History and Physical, chart, labs and discussed the procedure including the risks, benefits and alternatives for the proposed anesthesia with the patient or authorized representative who has indicated his/her understanding and acceptance.   Dental advisory given  Plan Discussed with: CRNA  Anesthesia Plan Comments:         Anesthesia Quick Evaluation

## 2011-06-17 ENCOUNTER — Ambulatory Visit (HOSPITAL_COMMUNITY): Payer: BC Managed Care – PPO

## 2011-06-17 ENCOUNTER — Encounter (HOSPITAL_COMMUNITY): Payer: Self-pay | Admitting: Anesthesiology

## 2011-06-17 ENCOUNTER — Encounter (HOSPITAL_COMMUNITY): Payer: Self-pay | Admitting: *Deleted

## 2011-06-17 ENCOUNTER — Ambulatory Visit (HOSPITAL_COMMUNITY)
Admission: RE | Admit: 2011-06-17 | Discharge: 2011-06-18 | Disposition: A | Discharge status: Home / Self Care | Payer: BC Managed Care – PPO | Source: Ambulatory Visit | Attending: Specialist | Admitting: Specialist

## 2011-06-17 ENCOUNTER — Ambulatory Visit (HOSPITAL_COMMUNITY): Payer: BC Managed Care – PPO | Admitting: Anesthesiology

## 2011-06-17 ENCOUNTER — Encounter (HOSPITAL_COMMUNITY)
Admission: RE | Disposition: A | Discharge status: Home / Self Care | Payer: Self-pay | Source: Ambulatory Visit | Attending: Specialist

## 2011-06-17 DIAGNOSIS — I10 Essential (primary) hypertension: Secondary | ICD-10-CM | POA: Insufficient documentation

## 2011-06-17 DIAGNOSIS — X58XXXA Exposure to other specified factors, initial encounter: Secondary | ICD-10-CM | POA: Insufficient documentation

## 2011-06-17 DIAGNOSIS — Z79899 Other long term (current) drug therapy: Secondary | ICD-10-CM | POA: Insufficient documentation

## 2011-06-17 DIAGNOSIS — F17200 Nicotine dependence, unspecified, uncomplicated: Secondary | ICD-10-CM | POA: Insufficient documentation

## 2011-06-17 DIAGNOSIS — E785 Hyperlipidemia, unspecified: Secondary | ICD-10-CM | POA: Insufficient documentation

## 2011-06-17 DIAGNOSIS — S43499A Other sprain of unspecified shoulder joint, initial encounter: Secondary | ICD-10-CM | POA: Insufficient documentation

## 2011-06-17 DIAGNOSIS — M75100 Unspecified rotator cuff tear or rupture of unspecified shoulder, not specified as traumatic: Secondary | ICD-10-CM

## 2011-06-17 DIAGNOSIS — S43429A Sprain of unspecified rotator cuff capsule, initial encounter: Secondary | ICD-10-CM | POA: Insufficient documentation

## 2011-06-17 DIAGNOSIS — Z01812 Encounter for preprocedural laboratory examination: Secondary | ICD-10-CM | POA: Insufficient documentation

## 2011-06-17 DIAGNOSIS — E7800 Pure hypercholesterolemia, unspecified: Secondary | ICD-10-CM | POA: Insufficient documentation

## 2011-06-17 SURGERY — SHOULDER ARTHROSCOPY WITH ROTATOR CUFF REPAIR AND SUBACROMIAL DECOMPRESSION
Anesthesia: General | Site: Shoulder | Laterality: Right | Wound class: Clean

## 2011-06-17 MED ORDER — LIDOCAINE HCL (CARDIAC) 20 MG/ML IV SOLN
INTRAVENOUS | Status: DC | PRN
  Administered 2011-06-17: 100 mg via INTRAVENOUS

## 2011-06-17 MED ORDER — BUPIVACAINE-EPINEPHRINE 0.5% -1:200000 IJ SOLN
INTRAMUSCULAR | Status: DC | PRN
  Administered 2011-06-17: 50 mL

## 2011-06-17 MED ORDER — MIDAZOLAM HCL 5 MG/5ML IJ SOLN
INTRAMUSCULAR | Status: DC | PRN
  Administered 2011-06-17: 2 mg via INTRAVENOUS

## 2011-06-17 MED ORDER — EPINEPHRINE HCL 1 MG/ML IJ SOLN
INTRAMUSCULAR | Status: AC
  Filled 2011-06-17: qty 2

## 2011-06-17 MED ORDER — ONDANSETRON HCL 4 MG/2ML IJ SOLN: 4.0000 mg | Freq: Four times a day (QID) | INTRAMUSCULAR | Status: DC | PRN

## 2011-06-17 MED ORDER — ACETAMINOPHEN 325 MG PO TABS: 650.0000 mg | ORAL_TABLET | Freq: Four times a day (QID) | ORAL | Status: DC | PRN

## 2011-06-17 MED ORDER — EPHEDRINE SULFATE 50 MG/ML IJ SOLN
INTRAMUSCULAR | Status: DC | PRN
  Administered 2011-06-17: 5 mg via INTRAVENOUS

## 2011-06-17 MED ORDER — LACTATED RINGERS IR SOLN
Status: DC | PRN
  Administered 2011-06-17: 3000 mL

## 2011-06-17 MED ORDER — ONDANSETRON HCL 4 MG PO TABS: 4.0000 mg | ORAL_TABLET | Freq: Four times a day (QID) | ORAL | Status: DC | PRN

## 2011-06-17 MED ORDER — CEFAZOLIN SODIUM-DEXTROSE 2-3 GM-% IV SOLR
2.0000 g | Freq: Four times a day (QID) | INTRAVENOUS | Status: AC
  Administered 2011-06-17 – 2011-06-18 (×3): 2 g via INTRAVENOUS
  Filled 2011-06-17 (×4): qty 50

## 2011-06-17 MED ORDER — FLUOXETINE HCL 20 MG PO CAPS
20.0000 mg | ORAL_CAPSULE | Freq: Every day | ORAL | Status: DC
  Administered 2011-06-17 – 2011-06-18 (×2): 20 mg via ORAL
  Filled 2011-06-17 (×2): qty 1

## 2011-06-17 MED ORDER — DOCUSATE SODIUM 100 MG PO CAPS
100.0000 mg | ORAL_CAPSULE | Freq: Two times a day (BID) | ORAL | Status: DC
  Administered 2011-06-17 – 2011-06-18 (×3): 100 mg via ORAL
  Filled 2011-06-17 (×4): qty 1

## 2011-06-17 MED ORDER — KETOROLAC TROMETHAMINE 15 MG/ML IJ SOLN
15.0000 mg | Freq: Four times a day (QID) | INTRAMUSCULAR | Status: DC
  Administered 2011-06-17 – 2011-06-18 (×4): 15 mg via INTRAVENOUS
  Filled 2011-06-17 (×7): qty 1

## 2011-06-17 MED ORDER — PHENYLEPHRINE HCL 10 MG/ML IJ SOLN
INTRAMUSCULAR | Status: DC | PRN
  Administered 2011-06-17 (×3): 50 ug via INTRAVENOUS

## 2011-06-17 MED ORDER — ACETAMINOPHEN 10 MG/ML IV SOLN
INTRAVENOUS | Status: AC
  Filled 2011-06-17: qty 100

## 2011-06-17 MED ORDER — OXYCODONE-ACETAMINOPHEN 5-325 MG PO TABS
1.0000 | ORAL_TABLET | ORAL | Status: DC | PRN
  Administered 2011-06-17 – 2011-06-18 (×6): 2 via ORAL
  Filled 2011-06-17 (×6): qty 2

## 2011-06-17 MED ORDER — METOCLOPRAMIDE HCL 5 MG/ML IJ SOLN: 5.0000 mg | Freq: Three times a day (TID) | INTRAMUSCULAR | Status: DC | PRN

## 2011-06-17 MED ORDER — ACETAMINOPHEN 10 MG/ML IV SOLN
INTRAVENOUS | Status: DC | PRN
  Administered 2011-06-17: 1000 mg via INTRAVENOUS

## 2011-06-17 MED ORDER — CLINDAMYCIN PHOSPHATE 600 MG/50ML IV SOLN
600.0000 mg | Freq: Two times a day (BID) | INTRAVENOUS | Status: AC
  Administered 2011-06-17 – 2011-06-18 (×2): 600 mg via INTRAVENOUS
  Filled 2011-06-17 (×2): qty 50

## 2011-06-17 MED ORDER — CLINDAMYCIN PHOSPHATE 600 MG/50ML IV SOLN
INTRAVENOUS | Status: AC
  Filled 2011-06-17: qty 50

## 2011-06-17 MED ORDER — DIPHENHYDRAMINE HCL 12.5 MG/5ML PO ELIX: 12.5000 mg | ORAL_SOLUTION | ORAL | Status: DC | PRN

## 2011-06-17 MED ORDER — CLINDAMYCIN PHOSPHATE 600 MG/50ML IV SOLN
600.0000 mg | INTRAVENOUS | Status: AC
  Administered 2011-06-17: 600 mg via INTRAVENOUS

## 2011-06-17 MED ORDER — METOCLOPRAMIDE HCL 10 MG PO TABS: 5.0000 mg | ORAL_TABLET | Freq: Three times a day (TID) | ORAL | Status: DC | PRN

## 2011-06-17 MED ORDER — METHOCARBAMOL 100 MG/ML IJ SOLN
500.0000 mg | Freq: Four times a day (QID) | INTRAMUSCULAR | Status: DC | PRN
  Administered 2011-06-17: 500 mg via INTRAVENOUS
  Filled 2011-06-17 (×2): qty 5

## 2011-06-17 MED ORDER — HYDROMORPHONE HCL PF 1 MG/ML IJ SOLN
0.2500 mg | INTRAMUSCULAR | Status: DC | PRN
  Administered 2011-06-17 (×4): 0.5 mg via INTRAVENOUS

## 2011-06-17 MED ORDER — GLYCOPYRROLATE 0.2 MG/ML IJ SOLN
INTRAMUSCULAR | Status: DC | PRN
  Administered 2011-06-17: 0.6 mg via INTRAVENOUS

## 2011-06-17 MED ORDER — PROPOFOL 10 MG/ML IV BOLUS
INTRAVENOUS | Status: DC | PRN
  Administered 2011-06-17: 200 mg via INTRAVENOUS

## 2011-06-17 MED ORDER — ROCURONIUM BROMIDE 100 MG/10ML IV SOLN
INTRAVENOUS | Status: DC | PRN
  Administered 2011-06-17: 50 mg via INTRAVENOUS

## 2011-06-17 MED ORDER — HYDROMORPHONE HCL PF 1 MG/ML IJ SOLN
INTRAMUSCULAR | Status: AC
  Filled 2011-06-17: qty 1

## 2011-06-17 MED ORDER — SODIUM CHLORIDE 0.9 % IR SOLN
Status: DC | PRN
  Administered 2011-06-17: 10:00:00

## 2011-06-17 MED ORDER — CEFAZOLIN SODIUM-DEXTROSE 2-3 GM-% IV SOLR
INTRAVENOUS | Status: AC
  Filled 2011-06-17: qty 50

## 2011-06-17 MED ORDER — HYDROCHLOROTHIAZIDE 25 MG PO TABS
25.0000 mg | ORAL_TABLET | Freq: Every day | ORAL | Status: DC
  Administered 2011-06-18: 25 mg via ORAL
  Filled 2011-06-17: qty 1

## 2011-06-17 MED ORDER — BUPIVACAINE-EPINEPHRINE 0.5% -1:200000 IJ SOLN
INTRAMUSCULAR | Status: AC
  Filled 2011-06-17: qty 1

## 2011-06-17 MED ORDER — METHOCARBAMOL 500 MG PO TABS
500.0000 mg | ORAL_TABLET | Freq: Four times a day (QID) | ORAL | Status: DC | PRN
  Administered 2011-06-17: 500 mg via ORAL
  Filled 2011-06-17: qty 1

## 2011-06-17 MED ORDER — ACETAMINOPHEN 650 MG RE SUPP: 650.0000 mg | Freq: Four times a day (QID) | RECTAL | Status: DC | PRN

## 2011-06-17 MED ORDER — PHENOL 1.4 % MT LIQD: 1.0000 | OROMUCOSAL | Status: DC | PRN

## 2011-06-17 MED ORDER — LISINOPRIL-HYDROCHLOROTHIAZIDE 20-25 MG PO TABS: 1.0000 | ORAL_TABLET | Freq: Every day | ORAL | Status: DC

## 2011-06-17 MED ORDER — EPINEPHRINE HCL 1 MG/ML IJ SOLN
INTRAMUSCULAR | Status: DC | PRN
  Administered 2011-06-17: 1 mg

## 2011-06-17 MED ORDER — SODIUM CHLORIDE 0.9 % IV SOLN
INTRAVENOUS | Status: DC
  Administered 2011-06-17: 16:00:00 via INTRAVENOUS

## 2011-06-17 MED ORDER — FENTANYL CITRATE 0.05 MG/ML IJ SOLN
INTRAMUSCULAR | Status: DC | PRN
  Administered 2011-06-17 (×2): 50 ug via INTRAVENOUS
  Administered 2011-06-17: 100 ug via INTRAVENOUS
  Administered 2011-06-17: 50 ug via INTRAVENOUS

## 2011-06-17 MED ORDER — NEOSTIGMINE METHYLSULFATE 1 MG/ML IJ SOLN
INTRAMUSCULAR | Status: DC | PRN
  Administered 2011-06-17: 4 mg via INTRAVENOUS

## 2011-06-17 MED ORDER — PROMETHAZINE HCL 25 MG/ML IJ SOLN: 6.2500 mg | INTRAMUSCULAR | Status: DC | PRN

## 2011-06-17 MED ORDER — LACTATED RINGERS IV SOLN
INTRAVENOUS | Status: DC
  Administered 2011-06-17 (×3): via INTRAVENOUS

## 2011-06-17 MED ORDER — MENTHOL 3 MG MT LOZG: 1.0000 | LOZENGE | OROMUCOSAL | Status: DC | PRN

## 2011-06-17 MED ORDER — ONDANSETRON HCL 4 MG/2ML IJ SOLN
INTRAMUSCULAR | Status: DC | PRN
  Administered 2011-06-17: 4 mg via INTRAVENOUS

## 2011-06-17 MED ORDER — HYDROMORPHONE HCL PF 1 MG/ML IJ SOLN
0.5000 mg | INTRAMUSCULAR | Status: DC | PRN
  Administered 2011-06-17: 0.5 mg via INTRAVENOUS
  Administered 2011-06-18: 1 mg via INTRAVENOUS
  Filled 2011-06-17 (×2): qty 1

## 2011-06-17 MED ORDER — LISINOPRIL 20 MG PO TABS
20.0000 mg | ORAL_TABLET | Freq: Every day | ORAL | Status: DC
  Administered 2011-06-18: 20 mg via ORAL
  Filled 2011-06-17: qty 1

## 2011-06-17 MED ORDER — CEFAZOLIN SODIUM-DEXTROSE 2-3 GM-% IV SOLR
2.0000 g | INTRAVENOUS | Status: AC
  Administered 2011-06-17: 2 g via INTRAVENOUS

## 2011-06-17 SURGICAL SUPPLY — 53 items
ANCH SUT 2 CP-2 ROTR CUF (Anchor) ×4 IMPLANT
ANCH SUT PUSHLCK 24X4.5 STRL (Orthopedic Implant) ×4 IMPLANT
ANCHOR ROTATOR CUFF #2 (Anchor) ×5 IMPLANT
APL SKNCLS STERI-STRIP NONHPOA (GAUZE/BANDAGES/DRESSINGS) ×2
BENZOIN TINCTURE PRP APPL 2/3 (GAUZE/BANDAGES/DRESSINGS) ×2 IMPLANT
BLADE CUTTER GATOR 3.5 (BLADE) ×2 IMPLANT
BLADE SURG SZ11 CARB STEEL (BLADE) ×2 IMPLANT
BUR OVAL 4.0 (BURR) ×1 IMPLANT
CANNULA ACUFO 5X76 (CANNULA) ×2 IMPLANT
CHLORAPREP W/TINT 26ML (MISCELLANEOUS) IMPLANT
CLOTH BEACON ORANGE TIMEOUT ST (SAFETY) ×2 IMPLANT
CLSR STERI-STRIP ANTIMIC 1/2X4 (GAUZE/BANDAGES/DRESSINGS) ×2 IMPLANT
DECANTER SPIKE VIAL GLASS SM (MISCELLANEOUS) ×2 IMPLANT
DRAPE ORTHO SPLIT 77X108 STRL (DRAPES) ×2
DRAPE SURG ORHT 6 SPLT 77X108 (DRAPES) ×1 IMPLANT
DRAPE U-SHAPE 47X51 STRL (DRAPES) ×1 IMPLANT
DRAPE X-RAY CASS 24X20 (DRAPES) ×1 IMPLANT
DRSG EMULSION OIL 3X3 NADH (GAUZE/BANDAGES/DRESSINGS) ×2 IMPLANT
DRSG PAD ABDOMINAL 8X10 ST (GAUZE/BANDAGES/DRESSINGS) ×2 IMPLANT
DURAPREP 26ML APPLICATOR (WOUND CARE) ×2 IMPLANT
ELECT NDL TIP 2.8 STRL (NEEDLE) IMPLANT
ELECT NEEDLE TIP 2.8 STRL (NEEDLE) ×2 IMPLANT
GLOVE BIO SURGEON STRL SZ 6.5 (GLOVE) ×2 IMPLANT
GLOVE BIOGEL PI IND STRL 8 (GLOVE) ×1 IMPLANT
GLOVE BIOGEL PI INDICATOR 8 (GLOVE) ×1
GLOVE ECLIPSE 6.5 STRL STRAW (GLOVE) ×2 IMPLANT
GLOVE SURG SS PI 8.0 STRL IVOR (GLOVE) ×4 IMPLANT
GOWN PREVENTION PLUS LG XLONG (DISPOSABLE) ×2 IMPLANT
GOWN PREVENTION PLUS XLARGE (GOWN DISPOSABLE) ×2 IMPLANT
GOWN STRL REIN XL XLG (GOWN DISPOSABLE) ×2 IMPLANT
KIT BASIN OR (CUSTOM PROCEDURE TRAY) ×2 IMPLANT
MANIFOLD NEPTUNE II (INSTRUMENTS) ×2 IMPLANT
NDL SPNL 18GX3.5 QUINCKE PK (NEEDLE) ×1 IMPLANT
NEEDLE SPNL 18GX3.5 QUINCKE PK (NEEDLE) ×2 IMPLANT
PACK SHOULDER CUSTOM OPM052 (CUSTOM PROCEDURE TRAY) ×2 IMPLANT
POSITIONER SURGICAL ARM (MISCELLANEOUS) ×2 IMPLANT
PUSHLOCK PEEK 4.5X24 (Orthopedic Implant) ×5 IMPLANT
SET ARTHROSCOPY TUBING (MISCELLANEOUS) ×2
SET ARTHROSCOPY TUBING LN (MISCELLANEOUS) ×1 IMPLANT
SLING ARM IMMOBILIZER LRG (SOFTGOODS) IMPLANT
SLING ARM IMMOBILIZER MED (SOFTGOODS) ×1 IMPLANT
SPONGE GAUZE 4X4 12PLY (GAUZE/BANDAGES/DRESSINGS) ×1 IMPLANT
STRAP CHIN BCCS-OSI (MISCELLANEOUS) ×2 IMPLANT
STRIP CLOSURE SKIN 1/2X4 (GAUZE/BANDAGES/DRESSINGS) ×1 IMPLANT
SUT ETHIBOND 2 OS 4 DA (SUTURE) ×1 IMPLANT
SUT ETHILON 4 0 PS 2 18 (SUTURE) ×2 IMPLANT
SUT PROLENE 3 0 PS 2 (SUTURE) ×1 IMPLANT
SUT VIC AB 1-0 CT2 27 (SUTURE) ×2 IMPLANT
SUT VIC AB 2-0 CT2 27 (SUTURE) ×1 IMPLANT
SUT VICRYL 0-0 OS 2 NEEDLE (SUTURE) ×1 IMPLANT
TAPE CLOTH SURG 4X10 WHT LF (GAUZE/BANDAGES/DRESSINGS) ×1 IMPLANT
TUBING CONNECTING 10 (TUBING) ×2 IMPLANT
WAND 90 DEG TURBOVAC W/CORD (SURGICAL WAND) ×1 IMPLANT

## 2011-06-17 NOTE — H&P (Signed)
George Graham is an 56 y.o. male.   Chief Complaint: right shoulder pain HPI: RC tear right shoulder with biceps labral tear  Past Medical History  Diagnosis Date  . Depression   . Hyperlipidemia   . Tobacco abuse 12/15/2010  . Hypercholesteremia 12/15/2010  . Hard of hearing 12/15/2010  . Angina 12/12  . Hypertension     EKG, CHEST 12/12 EPIC  . Recurrent upper respiratory infection (URI)     "cold vs allergies" 3 weeks ago- states resolved- no fever    Past Surgical History  Procedure Date  . Colonoscopy   . Cardiac catheterization 12/12    EPIC    Family History  Problem Relation Age of Onset  . Depression Brother   . Coronary artery disease Maternal Grandfather   . Coronary artery disease Paternal Grandfather   . Prostate cancer Neg Hx   . Colon cancer Neg Hx   . Lung cancer Neg Hx   . Atrial fibrillation Mother   . Cancer Father    Social History:  reports that he has been smoking Cigarettes.  He has a 4.6 pack-year smoking history. He has never used smokeless tobacco. He reports that he does not drink alcohol or use illicit drugs.  Allergies: No Known Allergies  Medications Prior to Admission  Medication Sig Dispense Refill  . acetaminophen (TYLENOL) 500 MG tablet Take 500 mg by mouth every 6 (six) hours as needed.      . celecoxib (CELEBREX) 100 MG capsule Take 100 mg by mouth 2 (two) times daily.      Marland Kitchen FLUoxetine (PROZAC) 20 MG capsule Take 20 mg by mouth daily. Take in the morning      . ibuprofen (ADVIL,MOTRIN) 200 MG tablet Take 800 mg by mouth every 8 (eight) hours as needed.      Marland Kitchen lisinopril-hydrochlorothiazide (PRINZIDE,ZESTORETIC) 20-25 MG per tablet Take 1 tablet by mouth daily with breakfast.       . mupirocin ointment (BACTROBAN) 2 % Apply 1 application topically 2 (two) times daily. Will begin 06/11/11 PM      . oxyCODONE-acetaminophen (PERCOCET) 5-325 MG per tablet Take 1 tablet by mouth every 4 (four) hours as needed. For pain       .  HYDROcodone-acetaminophen (NORCO) 7.5-325 MG per tablet Take 1 tablet by mouth every 6 (six) hours as needed. For pain        No results found for this or any previous visit (from the past 48 hour(s)). No results found.  Review of Systems  Musculoskeletal: Positive for joint pain.  All other systems reviewed and are negative.    Blood pressure 137/88, pulse 75, temperature 97.8 F (36.6 C), resp. rate 20, SpO2 97.00%. Physical Exam  Vitals reviewed. Constitutional: He is oriented to person, place, and time. He appears well-developed.  HENT:  Head: Normocephalic.  Eyes: Pupils are equal, round, and reactive to light.  Neck: Normal range of motion.  Cardiovascular: Normal rate.   Respiratory: Effort normal.  GI: Soft.  Musculoskeletal: He exhibits tenderness.       Weak ER ABD. NT AC joint. NVI  Neurological: He is alert and oriented to person, place, and time.  Skin: Skin is warm and dry.  Psychiatric: He has a normal mood and affect.     Assessment/Plan Right retracted RC tear labral tear, biceps tear. Plan SA SAD RCR possible patch graft. Risks and benefits discussed  Angelene Rome C 06/17/2011, 8:23 AM

## 2011-06-17 NOTE — Anesthesia Postprocedure Evaluation (Signed)
  Anesthesia Post-op Note  Patient: George Graham  Procedure(s) Performed: Procedure(s) (LRB): SHOULDER ARTHROSCOPY WITH ROTATOR CUFF REPAIR AND SUBACROMIAL DECOMPRESSION (Right)  Patient Location: PACU  Anesthesia Type: General  Level of Consciousness: awake and alert   Airway and Oxygen Therapy: Patient Spontanous Breathing  Post-op Pain: mild  Post-op Assessment: Post-op Vital signs reviewed, Patient's Cardiovascular Status Stable, Respiratory Function Stable, Patent Airway and No signs of Nausea or vomiting  Post-op Vital Signs: stable  Complications: No apparent anesthesia complications

## 2011-06-17 NOTE — Brief Op Note (Signed)
06/17/2011  10:54 AM  PATIENT:  George Graham  56 y.o. male  PRE-OPERATIVE DIAGNOSIS:  rotator cuff tear on the right  POST-OPERATIVE DIAGNOSIS:  rotator cuff tear on the right  PROCEDURE:  Procedure(s) (LRB): SHOULDER ARTHROSCOPY WITH ROTATOR CUFF REPAIR AND SUBACROMIAL DECOMPRESSION (Right)  SURGEON:  Surgeon(s) and Role:    * Javier Docker, MD - Primary  PHYSICIAN ASSISTANT:   ASSISTANTS: strader   ANESTHESIA:   general  EBL:  Total I/O In: 1000 [I.V.:1000] Out: -   BLOOD ADMINISTERED:none  DRAINS: none   LOCAL MEDICATIONS USED:  MARCAINE     SPECIMEN:  No Specimen  DISPOSITION OF SPECIMEN:    COUNTS:   TOURNIQUET:  * No tourniquets in log *  DICTATION: .Other Dictation: Dictation Number  number not heard      PLAN OF CARE: Admit for overnight observation  PATIENT DISPOSITION:  PACU - hemodynamically stable.   Delay start of Pharmacological VTE agent (>24hrs) due to surgical blood loss or risk of bleeding: yes

## 2011-06-17 NOTE — Transfer of Care (Signed)
Immediate Anesthesia Transfer of Care Note  Patient: George Graham  Procedure(s) Performed: Procedure(s) (LRB): SHOULDER ARTHROSCOPY WITH ROTATOR CUFF REPAIR AND SUBACROMIAL DECOMPRESSION (Right)  Patient Location: PACU  Anesthesia Type: General  Level of Consciousness: awake, alert  and oriented  Airway & Oxygen Therapy: Patient Spontanous Breathing and Patient connected to face mask oxygen  Post-op Assessment: Report given to PACU RN and Post -op Vital signs reviewed and stable  Post vital signs: Reviewed and stable  Complications: No apparent anesthesia complications

## 2011-06-18 MED ORDER — METHOCARBAMOL 500 MG PO TABS: 500.0000 mg | ORAL_TABLET | Freq: Four times a day (QID) | ORAL | Status: AC | PRN

## 2011-06-18 MED ORDER — OXYCODONE-ACETAMINOPHEN 5-325 MG PO TABS: 2.0000 | ORAL_TABLET | ORAL | Status: DC | PRN

## 2011-06-18 NOTE — Op Note (Signed)
[name], Graham NO.:  [id number]  MEDICAL RECORD NO.:  [id number]  LOCATION:  1540                         FACILITY:  [facility]  PHYSICIAN:  Jene Every, M.D.    DATE OF BIRTH:  1955-09-11  DATE OF PROCEDURE:  06/17/2011 DATE OF DISCHARGE:                              OPERATIVE REPORT   PREOPERATIVE DIAGNOSES:  Massive tear at the rotator cuff, biceps tendon, labrum, right shoulder.  POSTOPERATIVE DIAGNOSES:  Massive tear at the rotator cuff, biceps tendon, labrum, right shoulder.  PROCEDURE:  Right shoulder arthroscopy, debridement of labrum, biceps tendon, subacromial decompression, bursectomy, mini-open rotator cuff repair utilizing 4 Mitek suture anchors and 2 push locks to repair the infraspinatus, supraspinatus, and subscapularis.  ANESTHESIA:  General.  ASSISTANT:  Roma Schanz, P.A.  HISTORY:  This is a 56 year old with massive tear of the rotator cuff, indicated for repair.  He had a biceps tendon tear with retraction of the tendon, significant tearing of the labrum and retracted rotator cuff.  He was indicated for evaluation, arthroscopic subacromial decompression, mini-open rotator cuff repair, debridement of labrum, possible debridement of the stump of the biceps.  TECHNIQUE:  With the patient supine in beach-chair position after induction of adequate general anesthesia, 2 g Kefzol and clindamycin, the right shoulder and upper extremity was prepped and draped in the usual sterile fashion.  Exam under anesthesia revealed full range. Surgical marker was utilized on the acromion, AC joint and coracoid. Standard posterolateral and anterolateral portals were utilized with incision through the skin only.  With the arm in the 70-30 position, we advanced the arthroscopic camera into the glenohumeral joint in line with coracoid penetrating it atraumatically.  Irrigant was utilized to insufflate the joint, 65 mmHg was utilized.  Under direct  visualization, the anterior portal was localized with an 18-gauge needle between the coracoid and the anterolateral aspect of the acromion.  Under direct observation, a small incision was made for the anterior portal.  We advanced the cannula into the glenohumeral joint anteriorly after observing the glenohumeral joint and the labrum was significantly torn. A small remnant of the biceps tendon was noted.  I introduced the shaver and debrided the labrum and the biceps stump to a residual base.  There was some glenohumeral arthrosis as well and a massive tear of the rotator cuff including the supraspinatus, subscap, and a portion of the infraspinatus.  Therefore, I redirected the camera in the subacromial space with a small incision anterolaterally triangulating the subacromial space.  Bursectomy was performed with hypertrophic bursa noted, saved the anterolateral aspect of the acromion, PA ligament.  The massive tear was retracted, then converted to a mini-open rotator cuff repair.  I removed all arthroscopic instrumentation and I made a small incision, which was premarked over the anterolateral aspect of the acromion, 2 cm.  Subcutaneous tissue was dissected with electrocautery to achieve hemostasis.  The raphe between the anterolateral heads were identified.  We used Marcaine with epinephrine at the deltoid.  We incised the subperiosteal, elevated the anterolateral and anteromedial aspect of the acromion preserving the AC attachment.  A 3 mm Kerrison was utilized to remove the anterolateral aspect of the acromion.  A general retractor  was then placed and we excised bursa, retracted tear was noted.  I spent a considerable time mobilizing the cuff posteriorly, anteriorly and medially, beneath the cuff and on top of it.  I had done a good portion of the supraspinatus and infraspinatus, some attenuation of the subscap was noted as well as the anterior aspect of the supraspinatus.  We  advanced it anterolaterally.  We prepared a bed in the cancellous bone with a Beyer rongeur.  We advanced it to this bed, we felt that was satisfactory.  With 2 Mitek suture anchors in at the junction of the supraspinatus and infraspinatus insertion and the supraspinatus and subscap.  I then advanced the tendon, held the arm in abduction, put 2 Mitek suture anchors near the border of the cartilage, threaded it through the tendon with a good surgical knot across these surgical leaflets remaining over the tuberosity and put 2 PushLock suture anchors in after using all PushLock and threading across the suture ends for double row fixation.  This appeared very well into the greater tuberosity with the Mitek suture anchors and we had good resistance to pullout.  I then posteriorly and anteriorly put a separate Mitek suture anchor at the subscap, anteriorly in the region of the lesser tuberosity.  That gave the bone good resistance to pullout and residual portion of the subscap was repaired as well, to the bone.  The biceps tendon was again absent from the bicipital groove.  This was medial to the bicipital groove.  We then placed one final Mitek posteriorly to lock in the infraspinatus, which was torn posteriorly as well.  We impacted that into the bone as well and threaded it through the Infraspinatus with a good surgical knot tie, this was secured to the bone and got full coverage.  The arm was at the side with full coverage, no tension.  We copiously irrigated the wound and obtained an x-ray intraoperatively to evaluate the Mitek suture anchors to ensure they were in the humerus, not outside of the humerus.  The wound was copiously irrigated.  We repaired the raphe with #1 Vicryl interrupted figure-of-eight suture, subcu with 2-0 Vicryl simple sutures, skin was reapproximated with 4-0 subcuticular Prolene.  The wound was reinforced with Steri-Strips.  Sterile dressing was applied after  copious irrigation with antibiotic irrigation.  The patient was placed on abduction pillow, extubated without difficulty, and transported to the recovery room in satisfactory condition.  The patient tolerated the procedure well.  No complications.  Minimal blood loss.     Jene Every, M.D.     Cordelia Pen  D:  06/17/2011  T:  06/18/2011  Job:  [id number]

## 2011-06-18 NOTE — Progress Notes (Signed)
OT Evaluation Note  Orders received for OT consult, one time eval completed and placed in the health history and assessment section of the hard chart.  Pt educated on positioning, sling wear, and ADLs.  Did not educated pt on any AROM exercises except hand and wrist based on orders written.  No further OT needs at this time.  Perrin Maltese, OTR/L Pager number [phone] 06/18/2011

## 2011-06-18 NOTE — Discharge Summary (Signed)
Patient ID: NOHLAN BURDIN MRN: [id number] DOB/AGE: 06/29/1955 56 y.o.  Admit date: 06/17/2011 Discharge date: 06/18/2011  Admission Diagnoses:  Right rotator cuff tear Past Medical History  Diagnosis Date  . Depression   . Hyperlipidemia   . Tobacco abuse 12/15/2010  . Hypercholesteremia 12/15/2010  . Hard of hearing 12/15/2010  . Angina 12/12  . Hypertension     EKG, CHEST 12/12 EPIC  . Recurrent upper respiratory infection (URI)     "cold vs allergies" 3 weeks ago- states resolved- no fever   Discharge Diagnoses:  Same s/p right rotator cuff repair   Surgeries: Procedure(s): SHOULDER ARTHROSCOPY WITH ROTATOR CUFF REPAIR AND SUBACROMIAL DECOMPRESSION on 06/17/2011   Consultants:  OT  Discharged Condition: Improved  Hospital Course: CARDALE DORER is an 56 y.o. male who was admitted 06/17/2011 for operative treatment of rotator cuff tear. Patient has severe unremitting pain that affects sleep, daily activities, and work/hobbies. After pre-op clearance the patient was taken to the operating room on 06/17/2011 and underwent  Procedure(s): SHOULDER ARTHROSCOPY WITH ROTATOR CUFF REPAIR AND SUBACROMIAL DECOMPRESSION.    Patient was given perioperative antibiotics: Anti-infectives     Start     Dose/Rate Route Frequency Ordered Stop   06/17/11 2200   clindamycin (CLEOCIN) IVPB 600 mg        600 mg 100 mL/hr over 30 Minutes Intravenous Every 12 hours 06/17/11 1344 06/18/11 1038   06/17/11 1500   ceFAZolin (ANCEF) IVPB 2 g/50 mL premix        2 g 100 mL/hr over 30 Minutes Intravenous Every 6 hours 06/17/11 1344 06/18/11 0356   06/17/11 0935   polymyxin B 500,000 Units, bacitracin 50,000 Units in sodium chloride irrigation 0.9 % 500 mL irrigation  Status:  Discontinued          As needed 06/17/11 0936 06/17/11 1110   06/17/11 0622   ceFAZolin (ANCEF) IVPB 2 g/50 mL premix        2 g 100 mL/hr over 30 Minutes Intravenous 60 min pre-op 06/17/11 0622 06/17/11 0843   06/17/11 0622    clindamycin (CLEOCIN) IVPB 600 mg        600 mg 100 mL/hr over 30 Minutes Intravenous 60 min pre-op 06/17/11 0622 06/17/11 0856           Patient was given sequential compression devices, early ambulation, and chemoprophylaxis to prevent DVT.  Patient benefited maximally from hospital stay and there were no complications.    Recent vital signs: Patient Vitals for the past 24 hrs:  BP Temp Temp src Pulse Resp SpO2 Height Weight  06/18/11 0635 - - - - - - 5\' 9"  (1.753 m) 81.647 kg (180 lb)  06/18/11 0615 111/72 mmHg 98.7 F (37.1 C) Oral 60  18  98 % - -  06/18/11 0138 104/65 mmHg 98.3 F (36.8 C) Oral 71  18  94 % - -  06/17/11 2209 111/64 mmHg 97.4 F (36.3 C) Oral 70  18  96 % - -  06/17/11 1800 122/78 mmHg 98.6 F (37 C) Oral 70  18  100 % - -  06/17/11 1634 125/71 mmHg 97.8 F (36.6 C) Oral 69  18  98 % - -  06/17/11 1534 136/70 mmHg 97.6 F (36.4 C) Oral 83  16  98 % - -     Recent laboratory studies: No results found for this basename: WBC:2,HGB:2,HCT:2,PLT:2,NA:2,K:2,CL:2,CO2:2,BUN:2,CREATININE:2,GLUCOSE:2,PT:2,INR:2,CALCIUM,2: in the last 72 hours   Discharge Medications:   Medication List  As of  06/18/2011  3:24 PM   STOP taking these medications         acetaminophen 500 MG tablet      HYDROcodone-acetaminophen 7.5-325 MG per tablet         TAKE these medications         celecoxib 100 MG capsule   Commonly known as: CELEBREX   Take 100 mg by mouth 2 (two) times daily.      ibuprofen 200 MG tablet   Commonly known as: ADVIL,MOTRIN   Take 800 mg by mouth every 8 (eight) hours as needed.      lisinopril-hydrochlorothiazide 20-25 MG per tablet   Commonly known as: PRINZIDE,ZESTORETIC   Take 1 tablet by mouth daily with breakfast.      methocarbamol 500 MG tablet   Commonly known as: ROBAXIN   Take 1 tablet (500 mg total) by mouth every 6 (six) hours as needed.      mupirocin ointment 2 %   Commonly known as: BACTROBAN   Apply 1 application topically 2  (two) times daily. Will begin 06/11/11 PM      oxyCODONE-acetaminophen 5-325 MG per tablet   Commonly known as: PERCOCET   Take 2 tablets by mouth every 4 (four) hours as needed for pain. For pain      PROZAC 20 MG capsule   Generic drug: FLUoxetine   Take 20 mg by mouth daily. Take in the morning            Diagnostic Studies: Dg Shoulder 1v Right  06/17/2011  *RADIOLOGY REPORT*  Clinical Data: Evaluate anchor placement for rotator cuff repair during surgery.  RIGHT SHOULDER - 1 VIEW  Comparison: Chest radiographs 12/15/2010.  Findings: 1042 hours.  A single view is submitted demonstrating two metallic anchor pins over the greater tuberosity.  There is no evidence of dislocation or acute fracture on single view.  IMPRESSION: Anchor sutures are situated over the greater tuberosity.  No acute osseous findings identified.  Original Report Authenticated By: Gerrianne Scale, M.D.    Disposition: 01-Home or Self Care  Discharge Orders    Future Orders Please Complete By Expires   Diet - low sodium heart healthy      Call MD / Call 911      Comments:   If you experience chest pain or shortness of breath, CALL 911 and be transported to the hospital emergency room.  If you develope a fever above 101 F, pus (white drainage) or increased drainage or redness at the wound, or calf pain, call your surgeon's office.   Constipation Prevention      Comments:   Drink plenty of fluids.  Prune juice may be helpful.  You may use a stool softener, such as Colace (over the counter) 100 mg twice a day.  Use MiraLax (over the counter) for constipation as needed.   Increase activity slowly as tolerated      Discharge instructions      Comments:   Use sling at all times except when exercising OK to move hand, wrist, and elbow Keep arm elevated on pillow when out of sling Change dressing daily Ok to shower in 72 hours No lifting No driving 2-4 weeks Sling at all times      Follow-up Information     Follow up with BEANE,JEFFREY C, MD in 14 days.   Contact information:   [facility] [address] [place] [address] [phone]  Signed: Navina Wohlers R. 06/18/2011, 3:24 PM

## 2011-06-18 NOTE — Progress Notes (Signed)
Pt is alert and oriented, vital signs are stable, dressing to right shoulder is clean dry and intact, patient tolerating regular diet without complaints of nausea or vomiting, discharge instructions reviewed with patient and spouse questions and concerns answered, prescription given, pt to follow with MD Beane with 14 days Means, Mannie Ohlin N RN 06-18-11 15:18pm

## 2012-01-19 ENCOUNTER — Encounter (HOSPITAL_COMMUNITY): Payer: Self-pay

## 2012-01-19 ENCOUNTER — Emergency Department (HOSPITAL_COMMUNITY)
Admission: EM | Admit: 2012-01-19 | Discharge: 2012-01-19 | Disposition: A | Discharge status: Home / Self Care | Payer: BC Managed Care – PPO | Attending: Emergency Medicine | Admitting: Emergency Medicine

## 2012-01-19 ENCOUNTER — Emergency Department (HOSPITAL_COMMUNITY): Payer: BC Managed Care – PPO

## 2012-01-19 DIAGNOSIS — F3289 Other specified depressive episodes: Secondary | ICD-10-CM | POA: Insufficient documentation

## 2012-01-19 DIAGNOSIS — K5730 Diverticulosis of large intestine without perforation or abscess without bleeding: Secondary | ICD-10-CM | POA: Insufficient documentation

## 2012-01-19 DIAGNOSIS — Z8679 Personal history of other diseases of the circulatory system: Secondary | ICD-10-CM | POA: Insufficient documentation

## 2012-01-19 DIAGNOSIS — Z79899 Other long term (current) drug therapy: Secondary | ICD-10-CM | POA: Insufficient documentation

## 2012-01-19 DIAGNOSIS — F17200 Nicotine dependence, unspecified, uncomplicated: Secondary | ICD-10-CM | POA: Insufficient documentation

## 2012-01-19 DIAGNOSIS — E7800 Pure hypercholesterolemia, unspecified: Secondary | ICD-10-CM | POA: Insufficient documentation

## 2012-01-19 DIAGNOSIS — Z8669 Personal history of other diseases of the nervous system and sense organs: Secondary | ICD-10-CM | POA: Insufficient documentation

## 2012-01-19 DIAGNOSIS — F329 Major depressive disorder, single episode, unspecified: Secondary | ICD-10-CM | POA: Insufficient documentation

## 2012-01-19 DIAGNOSIS — K5790 Diverticulosis of intestine, part unspecified, without perforation or abscess without bleeding: Secondary | ICD-10-CM

## 2012-01-19 DIAGNOSIS — K389 Disease of appendix, unspecified: Secondary | ICD-10-CM | POA: Insufficient documentation

## 2012-01-19 DIAGNOSIS — E785 Hyperlipidemia, unspecified: Secondary | ICD-10-CM | POA: Insufficient documentation

## 2012-01-19 DIAGNOSIS — I10 Essential (primary) hypertension: Secondary | ICD-10-CM | POA: Insufficient documentation

## 2012-01-19 DIAGNOSIS — K6389 Other specified diseases of intestine: Secondary | ICD-10-CM

## 2012-01-19 LAB — URINALYSIS, ROUTINE W REFLEX MICROSCOPIC
Glucose, UA: NEGATIVE mg/dL
Leukocytes, UA: NEGATIVE
Protein, ur: NEGATIVE mg/dL
pH: 7 (ref 5.0–8.0)

## 2012-01-19 LAB — COMPREHENSIVE METABOLIC PANEL
Albumin: 4.2 g/dL (ref 3.5–5.2)
BUN: 14 mg/dL (ref 6–23)
Creatinine, Ser: 0.65 mg/dL (ref 0.50–1.35)
Total Protein: 8.3 g/dL (ref 6.0–8.3)

## 2012-01-19 LAB — CBC WITH DIFFERENTIAL/PLATELET
Basophils Relative: 1 % (ref 0–1)
Eosinophils Absolute: 0.2 10*3/uL (ref 0.0–0.7)
HCT: 45.6 % (ref 39.0–52.0)
Hemoglobin: 15.5 g/dL (ref 13.0–17.0)
MCH: 31.9 pg (ref 26.0–34.0)
MCHC: 34 g/dL (ref 30.0–36.0)
Monocytes Absolute: 0.5 10*3/uL (ref 0.1–1.0)
Monocytes Relative: 8 % (ref 3–12)

## 2012-01-19 MED ORDER — IOHEXOL 300 MG/ML  SOLN
80.0000 mL | Freq: Once | INTRAMUSCULAR | Status: AC | PRN
  Administered 2012-01-19: 80 mL via INTRAVENOUS

## 2012-01-19 MED ORDER — ONDANSETRON HCL 4 MG PO TABS: 4.0000 mg | ORAL_TABLET | Freq: Four times a day (QID) | ORAL | Status: DC | PRN

## 2012-01-19 MED ORDER — MORPHINE SULFATE 4 MG/ML IJ SOLN
4.0000 mg | Freq: Once | INTRAMUSCULAR | Status: AC
  Administered 2012-01-19: 4 mg via INTRAVENOUS
  Filled 2012-01-19: qty 1

## 2012-01-19 MED ORDER — HYDROCODONE-ACETAMINOPHEN 5-325 MG PO TABS: ORAL_TABLET | ORAL | Status: DC

## 2012-01-19 MED ORDER — ONDANSETRON HCL 4 MG/2ML IJ SOLN
4.0000 mg | Freq: Once | INTRAMUSCULAR | Status: AC
  Administered 2012-01-19: 4 mg via INTRAVENOUS
  Filled 2012-01-19: qty 2

## 2012-01-19 NOTE — ED Notes (Signed)
Per pt: left lower abdomen pain that is sharp and intermittent.  Started 3 days ago.  Tender to palpate. Denies N/V/D and fever.

## 2012-01-19 NOTE — ED Provider Notes (Signed)
History     CSN: [id number]  Arrival date & time 01/19/12  1040   First MD Initiated Contact with Patient 01/19/12 1101      Chief Complaint  Patient presents with  . Abdominal Pain    (Consider location/radiation/quality/duration/timing/severity/associated sxs/prior treatment) HPI Patient reports he has had left lower quadrant pain for the past 3 days. He states it comes and goes and can last for several hours at a time. He states it's made worse when he walks or changes positions. Nothing makes it feel better. He describes the pain as sharp. He denies nausea, vomiting, diarrhea, constipation, or fever. He states he's never had this before. He does note since Thanksgiving gained about 20 pounds.  PCP Dr. Neale Burly  Past Medical History  Diagnosis Date  . Depression   . Hyperlipidemia   . Tobacco abuse 12/15/2010  . Hypercholesteremia 12/15/2010  . Hard of hearing 12/15/2010  . Angina 12/12  . Hypertension     EKG, CHEST 12/12 EPIC  . Recurrent upper respiratory infection (URI)     "cold vs allergies" 3 weeks ago- states resolved- no fever    Past Surgical History  Procedure Date  . Colonoscopy   . Cardiac catheterization 12/12    EPIC  . Cardiac catheterization 2012    Family History  Problem Relation Age of Onset  . Depression Brother   . Coronary artery disease Maternal Grandfather   . Coronary artery disease Paternal Grandfather   . Prostate cancer Neg Hx   . Colon cancer Neg Hx   . Lung cancer Neg Hx   . Atrial fibrillation Mother   . Cancer Father   MOP diverticulitis  History  Substance Use Topics  . Smoking status: Current Every Day Smoker -- 0.5 packs/day for 23 years    Types: Cigarettes  . Smokeless tobacco: Never Used  . Alcohol Use: No   employed   Review of Systems  All other systems reviewed and are negative.    Allergies  Review of patient's allergies indicates no known allergies.  Home Medications   Current Outpatient Rx  Name   Route  Sig  Dispense  Refill  . FLUOXETINE HCL 20 MG PO CAPS   Oral   Take 20 mg by mouth daily. Take in the morning         . IBUPROFEN 200 MG PO TABS   Oral   Take 800 mg by mouth every 8 (eight) hours as needed.         Marland Kitchen LISINOPRIL-HYDROCHLOROTHIAZIDE 20-25 MG PO TABS   Oral   Take 1 tablet by mouth daily.           BP 154/90  Pulse 88  Temp 97.7 F (36.5 C) (Oral)  Resp 18  SpO2 100%  Vital signs normal    Physical Exam  Nursing note and vitals reviewed. Constitutional: He is oriented to person, place, and time. He appears well-developed and well-nourished.  Non-toxic appearance. He does not appear ill. No distress.  HENT:  Head: Normocephalic and atraumatic.  Right Ear: External ear normal.  Left Ear: External ear normal.  Nose: Nose normal. No mucosal edema or rhinorrhea.  Mouth/Throat: Oropharynx is clear and moist and mucous membranes are normal. No dental abscesses or uvula swelling.  Eyes: Conjunctivae normal and EOM are normal. Pupils are equal, round, and reactive to light.  Neck: Normal range of motion and full passive range of motion without pain. Neck supple.  Cardiovascular: Normal rate, regular rhythm  and normal heart sounds.  Exam reveals no gallop and no friction rub.   No murmur heard. Pulmonary/Chest: Effort normal and breath sounds normal. No respiratory distress. He has no wheezes. He has no rhonchi. He has no rales. He exhibits no tenderness and no crepitus.  Abdominal: Soft. Normal appearance and bowel sounds are normal. He exhibits no distension. There is tenderness in the left lower quadrant. There is guarding. There is no rebound.    Musculoskeletal: Normal range of motion. He exhibits no edema and no tenderness.       Moves all extremities well.   Neurological: He is alert and oriented to person, place, and time. He has normal strength. No cranial nerve deficit.  Skin: Skin is warm, dry and intact. No rash noted. No erythema. No pallor.   Psychiatric: He has a normal mood and affect. His speech is normal and behavior is normal. His mood appears not anxious.    ED Course  Procedures (including critical care time)   Medications  morphine 4 MG/ML injection 4 mg (4 mg Intravenous Given 01/19/12 1207)  ondansetron (ZOFRAN) injection 4 mg (4 mg Intravenous Given 01/19/12 1208)  iohexol (OMNIPAQUE) 300 MG/ML solution 80 mL (80 mL Intravenous Contrast Given 01/19/12 1318)   Discussed his CT results. States the morphine helps, still has some soreness to palpation.  Results for orders placed during the hospital encounter of 01/19/12  CBC WITH DIFFERENTIAL      Component Value Range   WBC 6.0  4.0 - 10.5 K/uL   RBC 4.86  4.22 - 5.81 MIL/uL   Hemoglobin 15.5  13.0 - 17.0 g/dL   HCT 45.4  09.8 - 11.9 %   MCV 93.8  78.0 - 100.0 fL   MCH 31.9  26.0 - 34.0 pg   MCHC 34.0  30.0 - 36.0 g/dL   RDW 14.7  82.9 - 56.2 %   Platelets 280  150 - 400 K/uL   Neutrophils Relative 64  43 - 77 %   Neutro Abs 3.9  1.7 - 7.7 K/uL   Lymphocytes Relative 24  12 - 46 %   Lymphs Abs 1.5  0.7 - 4.0 K/uL   Monocytes Relative 8  3 - 12 %   Monocytes Absolute 0.5  0.1 - 1.0 K/uL   Eosinophils Relative 3  0 - 5 %   Eosinophils Absolute 0.2  0.0 - 0.7 K/uL   Basophils Relative 1  0 - 1 %   Basophils Absolute 0.0  0.0 - 0.1 K/uL  COMPREHENSIVE METABOLIC PANEL      Component Value Range   Sodium 137  135 - 145 mEq/L   Potassium 4.8  3.5 - 5.1 mEq/L   Chloride 100  96 - 112 mEq/L   CO2 27  19 - 32 mEq/L   Glucose, Bld 103 (*) 70 - 99 mg/dL   BUN 14  6 - 23 mg/dL   Creatinine, Ser 1.30  0.50 - 1.35 mg/dL   Calcium 9.7  8.4 - 86.5 mg/dL   Total Protein 8.3  6.0 - 8.3 g/dL   Albumin 4.2  3.5 - 5.2 g/dL   AST 37  0 - 37 U/L   ALT 28  0 - 53 U/L   Alkaline Phosphatase 52  39 - 117 U/L   Total Bilirubin 0.4  0.3 - 1.2 mg/dL   GFR calc non Af Amer >90  >90 mL/min   GFR calc Af Amer >90  >90 mL/min  URINALYSIS,  ROUTINE W REFLEX MICROSCOPIC       Component Value Range   Color, Urine YELLOW  YELLOW   APPearance CLEAR  CLEAR   Specific Gravity, Urine 1.010  1.005 - 1.030   pH 7.0  5.0 - 8.0   Glucose, UA NEGATIVE  NEGATIVE mg/dL   Hgb urine dipstick NEGATIVE  NEGATIVE   Bilirubin Urine NEGATIVE  NEGATIVE   Ketones, ur NEGATIVE  NEGATIVE mg/dL   Protein, ur NEGATIVE  NEGATIVE mg/dL   Urobilinogen, UA 0.2  0.0 - 1.0 mg/dL   Nitrite NEGATIVE  NEGATIVE   Leukocytes, UA NEGATIVE  NEGATIVE   Laboratory interpretation all normal    Ct Abdomen Pelvis W Contrast  01/19/2012  *RADIOLOGY REPORT*  Clinical Data: 3 days of left lower quadrant abdominal pain.  CT ABDOMEN AND PELVIS WITH CONTRAST  Technique:  Multidetector CT imaging of the abdomen and pelvis was performed following the standard protocol during bolus administration of intravenous contrast.  Contrast: 80mL OMNIPAQUE IOHEXOL 300 MG/ML  SOLN  Comparison: None.  Findings: Lung Bases: Mild dependent atelectasis.  Liver:  Normal.  Spleen:  Normal.  Gallbladder:  Normal.  Common bile duct:  Normal.  Pancreas:  Normal.  Adrenal glands:  Normal.  Kidneys:  Normal enhancement and delayed excretion of contrast. Both ureters appear within normal limits.  There is a small phlebolith adjacent to the distal right ureter/UVJ.  Stomach:  Normal.  Small bowel:  Normal.  Colon:   Normal appendix.  Colonic diverticulosis.  There are inflammatory changes in the pericolonic fat in the left lower quadrant (image number 67 series 2).  There is a halo of the soft tissue density around fat, compatible with epiploic appendagitis. There is no colonic inflammation or diverticulum in the area of inflammation.  Pelvic Genitourinary:  Mild prostatomegaly and prostate calcifications.  No free fluid.  Urinary bladder appears within normal limits.  No adenopathy.  Bones:  No aggressive osseous lesions.  Calcific tendonitis of the common hamstring origins.  Pubic symphysis degenerative change with subchondral geode on the  right in the parasymphyseal pubis.  Mild sacroiliac joint degenerative disease.  Vasculature: Atherosclerosis without acute abnormality.  IMPRESSION:  1.  Epiploic appendagitis at the junction of the descending colon and sigmoid colon. 2.  Colonic diverticulosis without diverticulitis.   Original Report Authenticated By: Andreas Newport, M.D.      1. Epiploic appendagitis   2. Diverticulosis      New Prescriptions   HYDROCODONE-ACETAMINOPHEN (NORCO/VICODIN) 5-325 MG PER TABLET    Take 1 or 2 po Q 6hrs for pain   ONDANSETRON (ZOFRAN) 4 MG TABLET    Take 1 tablet (4 mg total) by mouth every 6 (six) hours as needed for nausea.   Plan discharge  Devoria Albe, MD, FACEP    MDM          Ward Givens, MD 01/19/12 1409

## 2013-02-07 ENCOUNTER — Emergency Department (HOSPITAL_COMMUNITY)
Admission: EM | Admit: 2013-02-07 | Discharge: 2013-02-07 | Disposition: A | Discharge status: Home / Self Care | Payer: BC Managed Care – PPO | Source: Home / Self Care | Attending: Family Medicine | Admitting: Family Medicine

## 2013-02-07 ENCOUNTER — Emergency Department (INDEPENDENT_AMBULATORY_CARE_PROVIDER_SITE_OTHER): Payer: BC Managed Care – PPO

## 2013-02-07 ENCOUNTER — Encounter (HOSPITAL_COMMUNITY): Payer: Self-pay | Admitting: Emergency Medicine

## 2013-02-07 DIAGNOSIS — J029 Acute pharyngitis, unspecified: Secondary | ICD-10-CM

## 2013-02-07 DIAGNOSIS — M549 Dorsalgia, unspecified: Secondary | ICD-10-CM

## 2013-02-07 LAB — POCT URINALYSIS DIP (DEVICE)
Glucose, UA: NEGATIVE mg/dL
LEUKOCYTES UA: NEGATIVE
NITRITE: NEGATIVE
PH: 5.5 (ref 5.0–8.0)
Protein, ur: NEGATIVE mg/dL
Specific Gravity, Urine: 1.025 (ref 1.005–1.030)
UROBILINOGEN UA: 1 mg/dL (ref 0.0–1.0)

## 2013-02-07 LAB — POCT RAPID STREP A: Streptococcus, Group A Screen (Direct): NEGATIVE

## 2013-02-07 MED ORDER — ETODOLAC 500 MG PO TABS: 500.0000 mg | ORAL_TABLET | Freq: Two times a day (BID) | ORAL | Status: DC

## 2013-02-07 MED ORDER — TRAMADOL HCL 50 MG PO TABS: 50.0000 mg | ORAL_TABLET | Freq: Four times a day (QID) | ORAL | Status: DC | PRN

## 2013-02-07 NOTE — Discharge Instructions (Signed)
Pharyngitis °Pharyngitis is redness, pain, and swelling (inflammation) of your pharynx.  °CAUSES  °Pharyngitis is usually caused by infection. Most of the time, these infections are from viruses (viral) and are part of a cold. However, sometimes pharyngitis is caused by bacteria (bacterial). Pharyngitis can also be caused by allergies. Viral pharyngitis may be spread from person to person by coughing, sneezing, and personal items or utensils (cups, forks, spoons, toothbrushes). Bacterial pharyngitis may be spread from person to person by more intimate contact, such as kissing.  °SIGNS AND SYMPTOMS  °Symptoms of pharyngitis include:   °· Sore throat.   °· Tiredness (fatigue).   °· Low-grade fever.   °· Headache. °· Joint pain and muscle aches. °· Skin rashes. °· Swollen lymph nodes. °· Plaque-like film on throat or tonsils (often seen with bacterial pharyngitis). °DIAGNOSIS  °Your health care provider will ask you questions about your illness and your symptoms. Your medical history, along with a physical exam, is often all that is needed to diagnose pharyngitis. Sometimes, a rapid strep test is done. Other lab tests may also be done, depending on the suspected cause.  °TREATMENT  °Viral pharyngitis will usually get better in 3 4 days without the use of medicine. Bacterial pharyngitis is treated with medicines that kill germs (antibiotics).  °HOME CARE INSTRUCTIONS  °· Drink enough water and fluids to keep your urine clear or pale yellow.   °· Only take over-the-counter or prescription medicines as directed by your health care provider:   °· If you are prescribed antibiotics, make sure you finish them even if you start to feel better.   °· Do not take aspirin.   °· Get lots of rest.   °· Gargle with 8 oz of salt water (½ tsp of salt per 1 qt of water) as often as every 1 2 hours to soothe your throat.   °· Throat lozenges (if you are not at risk for choking) or sprays may be used to soothe your throat. °SEEK MEDICAL  CARE IF:  °· You have large, tender lumps in your neck. °· You have a rash. °· You cough up green, yellow-brown, or bloody spit. °SEEK IMMEDIATE MEDICAL CARE IF:  °· Your neck becomes stiff. °· You drool or are unable to swallow liquids. °· You vomit or are unable to keep medicines or liquids down. °· You have severe pain that does not go away with the use of recommended medicines. °· You have trouble breathing (not caused by a stuffy nose). °MAKE SURE YOU:  °· Understand these instructions. °· Will watch your condition. °· Will get help right away if you are not doing well or get worse. °Document Released: 12/29/2004 Document Revised: 10/19/2012 Document Reviewed: 09/05/2012 °[employer]® Patient Information ©2014 [employer]. ° °

## 2013-02-07 NOTE — ED Notes (Signed)
C/o  Scratchy throat.  Cough.  Nasal congestion.  Headache.  And fatigue.    Pt is also having lower back pain that is tender to touch.  Denies injury and urinary symptoms.  Symptoms present since 1/22.  Mild relief with ibuprofen.

## 2013-02-07 NOTE — ED Provider Notes (Signed)
CSN: [id number]     Arrival date & time 02/07/13  1400 History   None    Chief Complaint  Patient presents with  . URI  . Back Pain   (Consider location/radiation/quality/duration/timing/severity/associated sxs/prior Treatment) HPI Comments: 58 year old male presents complaining of scratchy throat, cough, nasal congestion, headache, fatigue and back pain. This is been ongoing since January 22. He has a history of pyelonephritis about 25 years ago that seemed very similar to this with the back pain. He is worried he may have this again. He had a temperature of 101.4 degrees earlier today. He is taking ibuprofen which has helped significantly. He is starting to feel better since taking the ibuprofen. No urinary symptoms.   Past Medical History  Diagnosis Date  . Depression   . Hyperlipidemia   . Tobacco abuse 12/15/2010  . Hypercholesteremia 12/15/2010  . Hard of hearing 12/15/2010  . Angina 12/12  . Hypertension     EKG, CHEST 12/12 EPIC  . Recurrent upper respiratory infection (URI)     "cold vs allergies" 3 weeks ago- states resolved- no fever   Past Surgical History  Procedure Laterality Date  . Colonoscopy    . Cardiac catheterization  12/12    EPIC  . Cardiac catheterization  2012   Family History  Problem Relation Age of Onset  . Depression Brother   . Coronary artery disease Maternal Grandfather   . Coronary artery disease Paternal Grandfather   . Prostate cancer Neg Hx   . Colon cancer Neg Hx   . Lung cancer Neg Hx   . Atrial fibrillation Mother   . Cancer Father    History  Substance Use Topics  . Smoking status: Current Every Day Smoker -- 0.50 packs/day for 23 years    Types: Cigarettes  . Smokeless tobacco: Never Used  . Alcohol Use: No    Review of Systems  Constitutional: Positive for fever. Negative for chills and fatigue.  HENT: Positive for congestion, rhinorrhea and sore throat (scratchy ). Negative for ear pain and sinus pressure.   Eyes: Negative  for visual disturbance.  Respiratory: Negative for cough, chest tightness, shortness of breath and wheezing.   Cardiovascular: Negative for chest pain, palpitations and leg swelling.  Gastrointestinal: Negative for nausea, vomiting, abdominal pain, diarrhea and constipation.  Genitourinary: Negative for dysuria, urgency, frequency and hematuria.  Musculoskeletal: Positive for back pain. Negative for arthralgias, myalgias, neck pain and neck stiffness.  Skin: Negative for rash.  Neurological: Positive for headaches. Negative for dizziness, weakness and light-headedness.    Allergies  Review of patient's allergies indicates no known allergies.  Home Medications   Current Outpatient Rx  Name  Route  Sig  Dispense  Refill  . ibuprofen (ADVIL,MOTRIN) 200 MG tablet   Oral   Take 800 mg by mouth every 8 (eight) hours as needed.         Marland Kitchen lisinopril-hydrochlorothiazide (PRINZIDE,ZESTORETIC) 20-25 MG per tablet   Oral   Take 1 tablet by mouth daily.         Marland Kitchen etodolac (LODINE) 500 MG tablet   Oral   Take 1 tablet (500 mg total) by mouth 2 (two) times daily.   30 tablet   1   . FLUoxetine (PROZAC) 20 MG capsule   Oral   Take 20 mg by mouth daily. Take in the morning         . HYDROcodone-acetaminophen (NORCO/VICODIN) 5-325 MG per tablet      Take 1 or 2 po  Q 6hrs for pain   30 tablet   0   . ondansetron (ZOFRAN) 4 MG tablet   Oral   Take 1 tablet (4 mg total) by mouth every 6 (six) hours as needed for nausea.   8 tablet   0   . traMADol (ULTRAM) 50 MG tablet   Oral   Take 1 tablet (50 mg total) by mouth every 6 (six) hours as needed.   15 tablet   0    BP 109/68  Pulse 90  Temp(Src) 99.6 F (37.6 C) (Oral)  Resp 16  SpO2 96% Physical Exam  Nursing note and vitals reviewed. Constitutional: He is oriented to person, place, and time. He appears well-developed and well-nourished. No distress.  HENT:  Head: Normocephalic and atraumatic.  Nose: Nose normal.  Right sinus exhibits no maxillary sinus tenderness and no frontal sinus tenderness. Left sinus exhibits no maxillary sinus tenderness and no frontal sinus tenderness.  Mouth/Throat: Uvula is midline and mucous membranes are normal. Posterior oropharyngeal erythema present. No oropharyngeal exudate.  Eyes: Conjunctivae are normal. Right eye exhibits no discharge.  Neck: Normal range of motion. Neck supple.  Cardiovascular: Normal rate, regular rhythm and normal heart sounds.  Exam reveals no gallop and no friction rub.   No murmur heard. Pulmonary/Chest: Effort normal and breath sounds normal. No respiratory distress. He has no wheezes. He has no rales (faint LML rales).  Lymphadenopathy:    He has no cervical adenopathy.  Neurological: He is alert and oriented to person, place, and time. Coordination normal.  Skin: Skin is warm and dry. No rash noted. He is not diaphoretic.  Psychiatric: He has a normal mood and affect. Judgment normal.    ED Course  Procedures (including critical care time) Labs Review Labs Reviewed  POCT URINALYSIS DIP (DEVICE) - Abnormal; Notable for the following:    Bilirubin Urine SMALL (*)    Ketones, ur TRACE (*)    Hgb urine dipstick TRACE (*)    All other components within normal limits  URINE CULTURE  CULTURE, GROUP A STREP  POCT RAPID STREP A ([facility]ARE ONLY)   Imaging Review Dg Chest 2 View  02/07/2013   CLINICAL DATA:  Cough for 5 days upper respiratory infection and back pain.  EXAM: CHEST  2 VIEW  COMPARISON:  DG CHEST 2 VIEW dated 12/15/2010  FINDINGS: Cardiopericardial silhouette within normal limits. Mediastinal contours normal. Trachea midline. No airspace disease or effusion. Postsurgical changes of right rotator cuff repair are new since the prior exam.  IMPRESSION: No active cardiopulmonary disease.   Electronically Signed   By: Andreas NewportGeoffrey  Lamke M.D.   On: 02/07/2013 15:17      MDM   1. Viral pharyngitis   2. Back pain    Urinalysis does  not indicate any infection, culture sent to confirm. Strep is negative. Chest x-ray is negative. This is most likely viral pharyngitis. Followup when necessary if this does not resolve within a couple of days. Treating symptomatically.   Meds ordered this encounter  Medications  . etodolac (LODINE) 500 MG tablet    Sig: Take 1 tablet (500 mg total) by mouth 2 (two) times daily.    Dispense:  30 tablet    Refill:  1  . traMADol (ULTRAM) 50 MG tablet    Sig: Take 1 tablet (50 mg total) by mouth every 6 (six) hours as needed.    Dispense:  15 tablet    Refill:  0  Graylon Good, PA-C 02/07/13 1623

## 2013-02-08 LAB — URINE CULTURE
Colony Count: NO GROWTH
Culture: NO GROWTH

## 2013-02-08 NOTE — ED Provider Notes (Signed)
Medical screening examination/treatment/procedure(s) were performed by a resident physician or non-physician practitioner and as the supervising physician I was immediately available for consultation/collaboration.  Kamrie Fanton, MD    Alie Hardgrove S Una Yeomans, MD 02/08/13 0750 

## 2013-02-09 LAB — CULTURE, GROUP A STREP

## 2013-06-24 IMAGING — CT CT ABD-PELV W/ CM
1 of 3 series · 14 of 32 positions shown, 19 images · IV contrast (OMNIPAQUE 300)
Comparison: None.

CLINICAL DATA: 3 days of left lower quadrant abdominal pain.

CT ABDOMEN AND PELVIS WITH CONTRAST
TECHNIQUE: Multidetector CT imaging of the abdomen and pelvis was
performed following the standard protocol during bolus
administration of intravenous contrast.
Contrast: 80mL OMNIPAQUE IOHEXOL 300 MG/ML  SOLN

[Series 2: abd/pel with · axial · 0.76mm/px · z∈[-494,-74]mm · 14 of 96 slices shown, 19 images]
[im 6/96  soft-tissue]
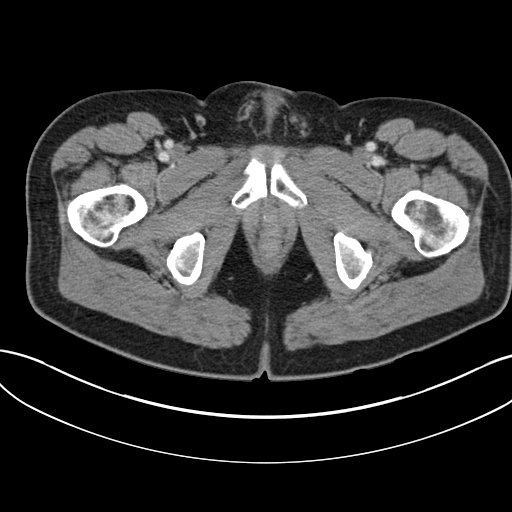
[im 6/96  bone]
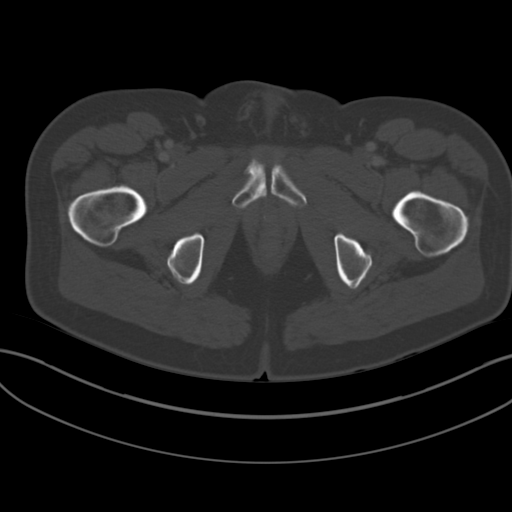
[im 11/96  soft-tissue]
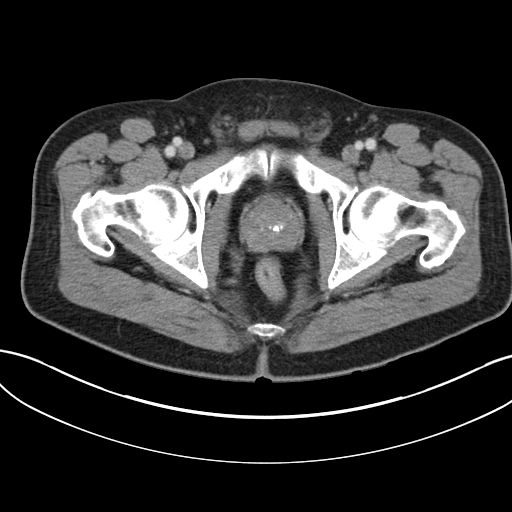
[im 22/96  soft-tissue]
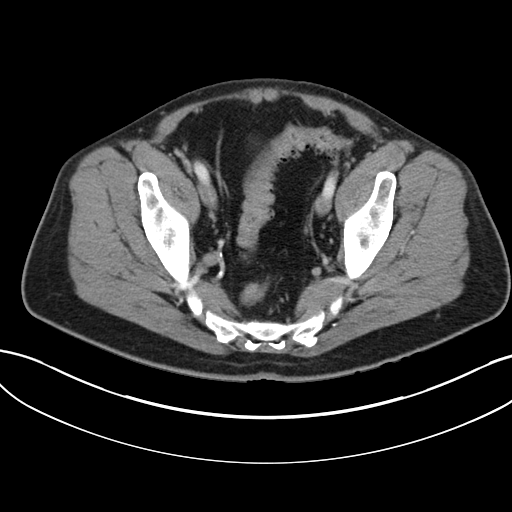
[im 27/96  soft-tissue]
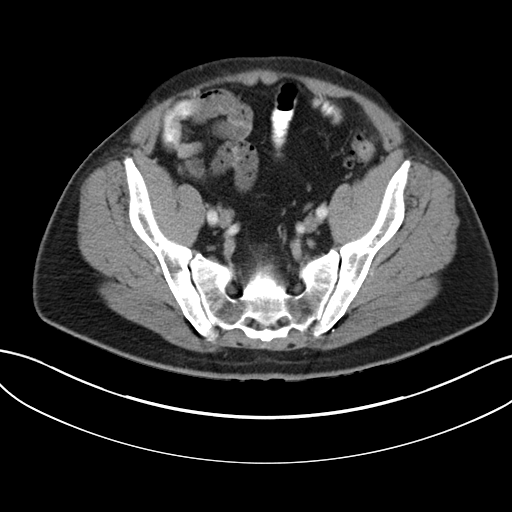
[im 32/96  soft-tissue]
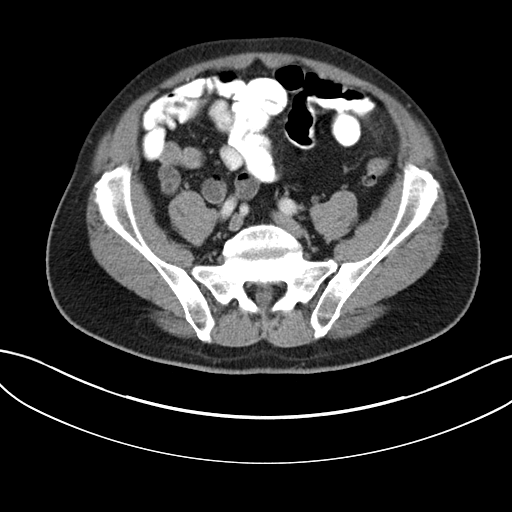
[im 43/96  soft-tissue]
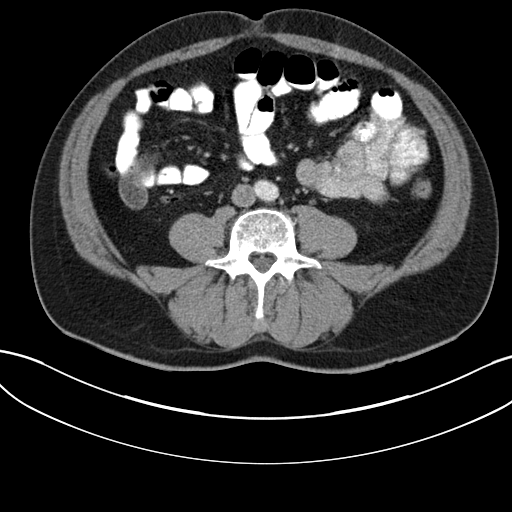
[im 48/96  soft-tissue]
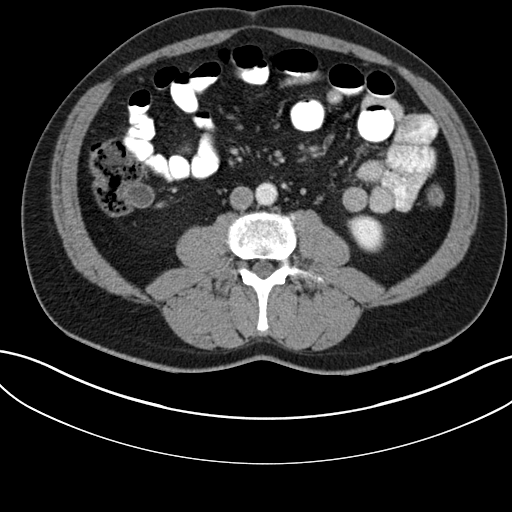
[im 53/96  soft-tissue]
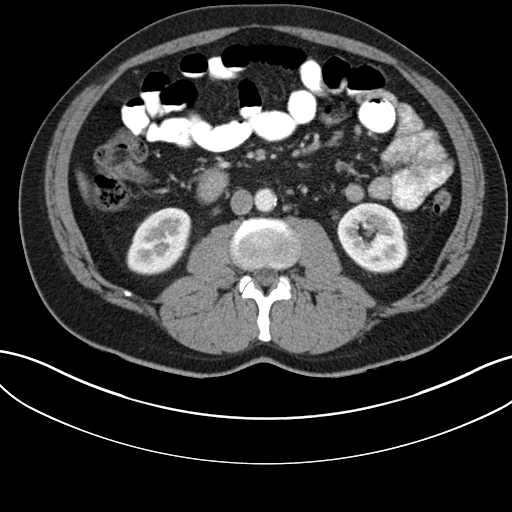
[im 64/96  soft-tissue]
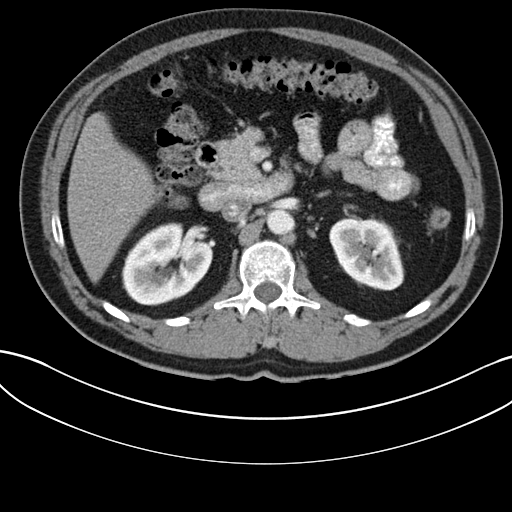
[im 64/96  bone]
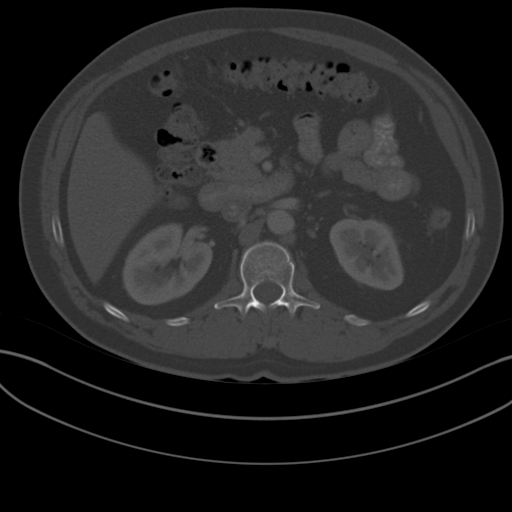
[im 69/96  soft-tissue]
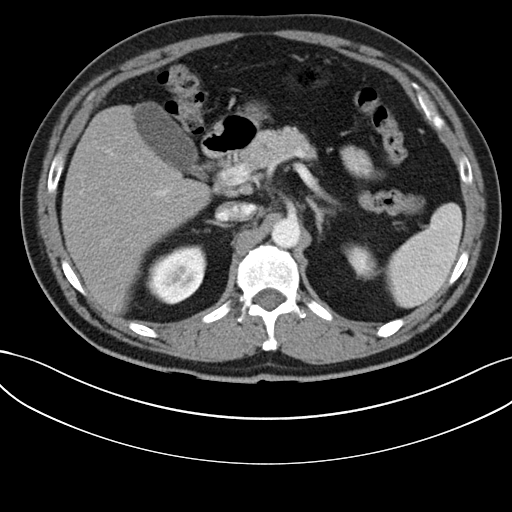
[im 74/96  soft-tissue]
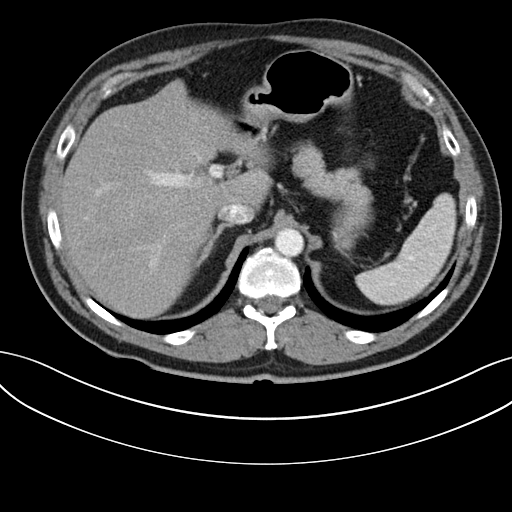
[im 74/96  lung]
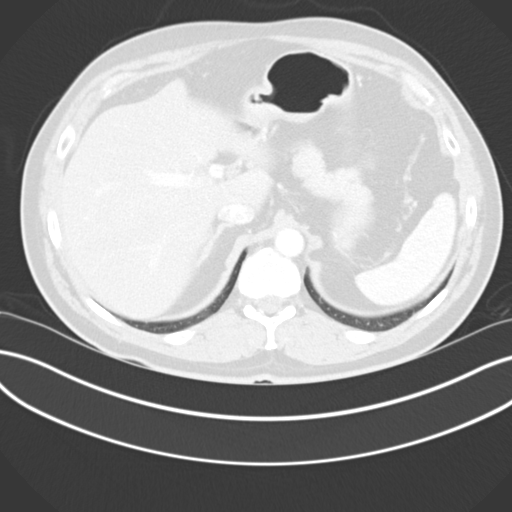
[im 80/96  lung]
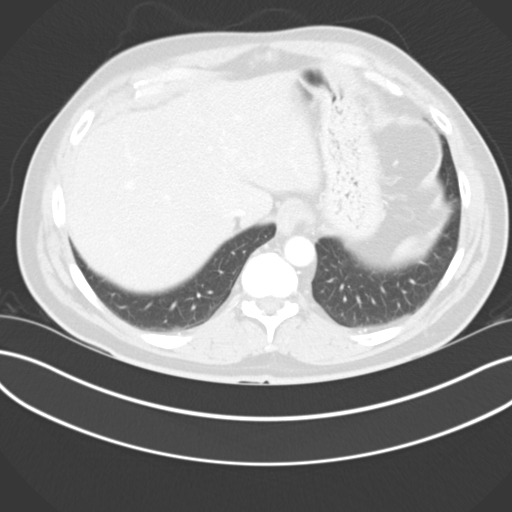
[im 85/96  soft-tissue]
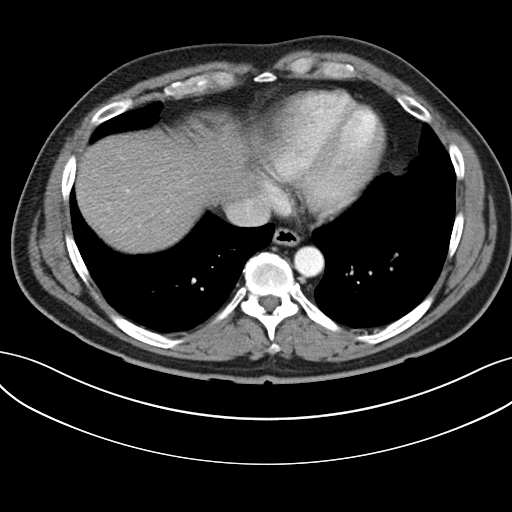
[im 85/96  lung]
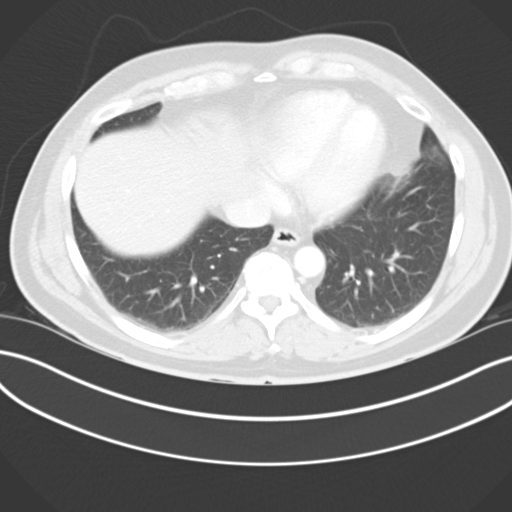
[im 90/96  soft-tissue]
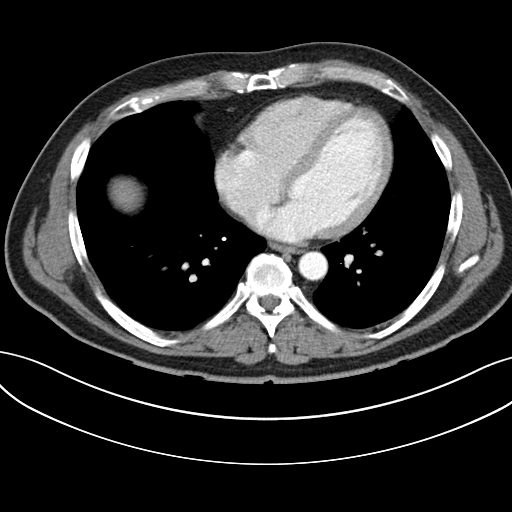
[im 90/96  lung]
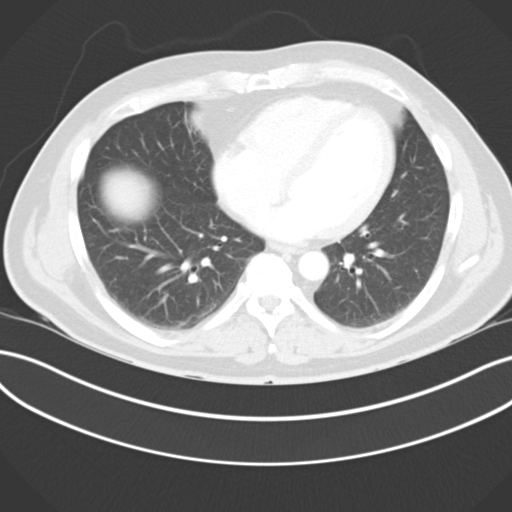

[14 of 32 positions shown; findings below may reference images not displayed]

FINDINGS: Lung Bases: Mild dependent atelectasis.

Liver:  Normal.

Spleen:  Normal.

Gallbladder:  Normal.

Common bile duct:  Normal.

Pancreas:  Normal.

Adrenal glands:  Normal.

Kidneys:  Normal enhancement and delayed excretion of contrast.
Both ureters appear within normal limits.  There is a small
phlebolith adjacent to the distal right ureter/UVJ.

Stomach:  Normal.

Small bowel:  Normal.

Colon:   Normal appendix.  Colonic diverticulosis.  There are
inflammatory changes in the pericolonic fat in the left lower
quadrant (image number 67 series 2).  There is a halo of the soft
tissue density around fat, compatible with epiploic appendagitis.
There is no colonic inflammation or diverticulum in the area of
inflammation.

Pelvic Genitourinary:  Mild prostatomegaly and prostate
calcifications.  No free fluid.  Urinary bladder appears within
normal limits.  No adenopathy.

Bones:  No aggressive osseous lesions.  Calcific tendonitis of the
common hamstring origins.  Pubic symphysis degenerative change with
subchondral geode on the right in the parasymphyseal pubis.  Mild
sacroiliac joint degenerative disease.

Vasculature: Atherosclerosis without acute abnormality.
IMPRESSION: 1.  Epiploic appendagitis at the junction of the descending colon
and sigmoid colon.
2.  Colonic diverticulosis without diverticulitis.

## 2013-10-27 ENCOUNTER — Encounter: Payer: Self-pay | Admitting: Gastroenterology

## 2013-12-20 ENCOUNTER — Encounter (HOSPITAL_COMMUNITY): Payer: Self-pay | Admitting: Internal Medicine

## 2014-11-26 ENCOUNTER — Encounter: Payer: Self-pay | Admitting: Gastroenterology

## 2015-03-01 ENCOUNTER — Encounter (HOSPITAL_COMMUNITY): Payer: Self-pay | Admitting: *Deleted

## 2015-03-01 ENCOUNTER — Emergency Department (HOSPITAL_COMMUNITY)
Admission: EM | Admit: 2015-03-01 | Discharge: 2015-03-01 | Disposition: A | Discharge status: Home / Self Care | Payer: BLUE CROSS/BLUE SHIELD | Attending: Emergency Medicine | Admitting: Emergency Medicine

## 2015-03-01 ENCOUNTER — Emergency Department (HOSPITAL_COMMUNITY): Payer: BLUE CROSS/BLUE SHIELD

## 2015-03-01 DIAGNOSIS — Z8709 Personal history of other diseases of the respiratory system: Secondary | ICD-10-CM | POA: Diagnosis not present

## 2015-03-01 DIAGNOSIS — R1011 Right upper quadrant pain: Secondary | ICD-10-CM | POA: Diagnosis present

## 2015-03-01 DIAGNOSIS — R109 Unspecified abdominal pain: Secondary | ICD-10-CM

## 2015-03-01 DIAGNOSIS — Z79899 Other long term (current) drug therapy: Secondary | ICD-10-CM | POA: Diagnosis not present

## 2015-03-01 DIAGNOSIS — R1013 Epigastric pain: Secondary | ICD-10-CM | POA: Insufficient documentation

## 2015-03-01 DIAGNOSIS — R112 Nausea with vomiting, unspecified: Secondary | ICD-10-CM | POA: Insufficient documentation

## 2015-03-01 DIAGNOSIS — I10 Essential (primary) hypertension: Secondary | ICD-10-CM | POA: Insufficient documentation

## 2015-03-01 DIAGNOSIS — F329 Major depressive disorder, single episode, unspecified: Secondary | ICD-10-CM | POA: Diagnosis not present

## 2015-03-01 DIAGNOSIS — Z9889 Other specified postprocedural states: Secondary | ICD-10-CM | POA: Diagnosis not present

## 2015-03-01 DIAGNOSIS — H9190 Unspecified hearing loss, unspecified ear: Secondary | ICD-10-CM | POA: Diagnosis not present

## 2015-03-01 DIAGNOSIS — F17210 Nicotine dependence, cigarettes, uncomplicated: Secondary | ICD-10-CM | POA: Insufficient documentation

## 2015-03-01 DIAGNOSIS — Z7982 Long term (current) use of aspirin: Secondary | ICD-10-CM | POA: Diagnosis not present

## 2015-03-01 DIAGNOSIS — Z8639 Personal history of other endocrine, nutritional and metabolic disease: Secondary | ICD-10-CM | POA: Diagnosis not present

## 2015-03-01 DIAGNOSIS — I209 Angina pectoris, unspecified: Secondary | ICD-10-CM | POA: Insufficient documentation

## 2015-03-01 LAB — COMPREHENSIVE METABOLIC PANEL
ALBUMIN: 4.6 g/dL (ref 3.5–5.0)
ALT: 24 U/L (ref 17–63)
AST: 27 U/L (ref 15–41)
Alkaline Phosphatase: 48 U/L (ref 38–126)
Anion gap: 11 (ref 5–15)
BILIRUBIN TOTAL: 0.7 mg/dL (ref 0.3–1.2)
BUN: 20 mg/dL (ref 6–20)
CHLORIDE: 100 mmol/L — AB (ref 101–111)
CO2: 25 mmol/L (ref 22–32)
CREATININE: 0.85 mg/dL (ref 0.61–1.24)
Calcium: 9.3 mg/dL (ref 8.9–10.3)
GFR calc Af Amer: >60 mL/min (ref >60)
GFR calc non Af Amer: >60 mL/min (ref >60)
GLUCOSE: 133 mg/dL — AB (ref 65–99)
Potassium: 4 mmol/L (ref 3.5–5.1)
Sodium: 136 mmol/L (ref 135–145)
Total Protein: 8 g/dL (ref 6.5–8.1)

## 2015-03-01 LAB — CBC WITH DIFFERENTIAL/PLATELET
BASOS ABS: 0 10*3/uL (ref 0.0–0.1)
Basophils Relative: 0 %
Eosinophils Absolute: 0 10*3/uL (ref 0.0–0.7)
Eosinophils Relative: 0 %
HCT: 40.6 % (ref 39.0–52.0)
Hemoglobin: 13.9 g/dL (ref 13.0–17.0)
LYMPHS PCT: 7 %
Lymphs Abs: 0.5 10*3/uL — ABNORMAL LOW (ref 0.7–4.0)
MCH: 31.8 pg (ref 26.0–34.0)
MCHC: 34.2 g/dL (ref 30.0–36.0)
MCV: 92.9 fL (ref 78.0–100.0)
Monocytes Absolute: 0.5 10*3/uL (ref 0.1–1.0)
Monocytes Relative: 7 %
NEUTROS ABS: 7.2 10*3/uL (ref 1.7–7.7)
Neutrophils Relative %: 86 %
PLATELETS: 262 10*3/uL (ref 150–400)
RBC: 4.37 MIL/uL (ref 4.22–5.81)
RDW: 12.9 % (ref 11.5–15.5)
WBC: 8.4 10*3/uL (ref 4.0–10.5)

## 2015-03-01 LAB — TROPONIN I: Troponin I: <0.03 ng/mL (ref <0.031)

## 2015-03-01 LAB — LIPASE, BLOOD: Lipase: 32 U/L (ref 11–51)

## 2015-03-01 MED ORDER — ONDANSETRON 4 MG PO TBDP: 4.0000 mg | ORAL_TABLET | Freq: Three times a day (TID) | ORAL | Status: DC | PRN

## 2015-03-01 MED ORDER — ONDANSETRON HCL 4 MG/2ML IJ SOLN
4.0000 mg | Freq: Once | INTRAMUSCULAR | Status: AC
  Administered 2015-03-01: 4 mg via INTRAVENOUS
  Filled 2015-03-01: qty 2

## 2015-03-01 MED ORDER — FENTANYL CITRATE (PF) 100 MCG/2ML IJ SOLN
50.0000 ug | Freq: Once | INTRAMUSCULAR | Status: AC
  Administered 2015-03-01: 50 ug via INTRAVENOUS
  Filled 2015-03-01: qty 2

## 2015-03-01 MED ORDER — SODIUM CHLORIDE 0.9 % IV BOLUS (SEPSIS)
1000.0000 mL | Freq: Once | INTRAVENOUS | Status: AC
  Administered 2015-03-01: 1000 mL via INTRAVENOUS

## 2015-03-01 MED ORDER — GI COCKTAIL ~~LOC~~
30.0000 mL | Freq: Once | ORAL | Status: AC
  Administered 2015-03-01: 30 mL via ORAL
  Filled 2015-03-01: qty 30

## 2015-03-01 NOTE — ED Provider Notes (Signed)
CSN: [id number]     Arrival date & time 03/01/15  0457 History   First MD Initiated Contact with Patient 03/01/15 0720     Chief Complaint  Patient presents with  . Abdominal Pain     (Consider location/radiation/quality/duration/timing/severity/associated sxs/prior Treatment) HPI Patient presents with concern of new epigastric and right upper quadrant pain. Pain is in present for almost 12 hours, occurred without clear precipitant. Since onset the pain has been persistent, is 6/10. There is associated nausea, retching, no profuse vomiting. Patient one episode of loose stool yesterday. No clear alleviating or exacerbating factors. Patient has history of tobacco abuse, in recent cold, is using Zicam. Patient has also switched his diet recently, trying to incorporate more solid, fish.   Past Medical History  Diagnosis Date  . Depression   . Hyperlipidemia   . Tobacco abuse 12/15/2010  . Hypercholesteremia 12/15/2010  . Hard of hearing 12/15/2010  . Angina 12/12  . Hypertension     EKG, CHEST 12/12 EPIC  . Recurrent upper respiratory infection (URI)     "cold vs allergies" 3 weeks ago- states resolved- no fever   Past Surgical History  Procedure Laterality Date  . Colonoscopy    . Cardiac catheterization  12/12    EPIC  . Cardiac catheterization  2012  . Left heart catheterization with coronary angiogram N/A 12/16/2010    Procedure: LEFT HEART CATHETERIZATION WITH CORONARY ANGIOGRAM;  Surgeon: Chrystie Nose, MD;  Location: [facility] CATH LAB;  Service: Cardiovascular;  Laterality: N/A;   Family History  Problem Relation Age of Onset  . Depression Brother   . Coronary artery disease Maternal Grandfather   . Coronary artery disease Paternal Grandfather   . Prostate cancer Neg Hx   . Colon cancer Neg Hx   . Lung cancer Neg Hx   . Atrial fibrillation Mother   . Cancer Father    Social History  Substance Use Topics  . Smoking status: Current Every Day Smoker -- 0.50 packs/day  for 23 years    Types: Cigarettes  . Smokeless tobacco: Never Used  . Alcohol Use: No    Review of Systems  Constitutional:       Per HPI, otherwise negative  HENT:       Per HPI, otherwise negative  Respiratory:       Per HPI, otherwise negative  Cardiovascular:       Per HPI, otherwise negative  Gastrointestinal: Positive for nausea, vomiting and abdominal pain.  Endocrine:       Negative aside from HPI  Genitourinary:       Neg aside from HPI   Musculoskeletal:       Per HPI, otherwise negative  Skin: Negative.   Neurological: Negative for syncope.      Allergies  Review of patient's allergies indicates no known allergies.  Home Medications   Prior to Admission medications   Medication Sig Start Date End Date Taking? Authorizing Provider  aspirin 325 MG tablet Take 325 mg by mouth daily as needed for moderate pain or headache.   Yes Historical Provider, MD  ELDERBERRY PO Take 1 tablet by mouth daily.   Yes Historical Provider, MD  FLUoxetine (PROZAC) 20 MG capsule Take 20 mg by mouth every morning. Take in the mornin   Yes Historical Provider, MD  lisinopril-hydrochlorothiazide (PRINZIDE,ZESTORETIC) 20-25 MG per tablet Take 1 tablet by mouth daily.   Yes Historical Provider, MD  ondansetron (ZOFRAN ODT) 4 MG disintegrating tablet Take 1 tablet (4 mg  total) by mouth every 8 (eight) hours as needed for nausea or vomiting. 03/01/15   Gerhard Munch, MD   BP 110/59 mmHg  Pulse 74  Temp(Src) 98.4 F (36.9 C) (Oral)  Resp 14  Ht  (1.753 m)  Wt 188 lb (85.276 kg)  BMI 27.75 kg/m2  SpO2 98% Physical Exam  Constitutional: He is oriented to person, place, and time. He appears well-developed. No distress.  HENT:  Head: Normocephalic and atraumatic.  Eyes: Conjunctivae and EOM are normal.  Cardiovascular: Normal rate and regular rhythm.   Pulmonary/Chest: Effort normal. No stridor. No respiratory distress.  Abdominal: He exhibits no distension. There is tenderness  in the right upper quadrant and epigastric area. There is no rigidity and no guarding.  Musculoskeletal: He exhibits no edema.  Neurological: He is alert and oriented to person, place, and time.  Skin: Skin is warm and dry.  Psychiatric: He has a normal mood and affect.  Nursing note and vitals reviewed.   ED Course  Procedures (including critical care time) Labs Review Labs Reviewed  CBC WITH DIFFERENTIAL/PLATELET - Abnormal; Notable for the following:    Lymphs Abs 0.5 (*)    All other components within normal limits  COMPREHENSIVE METABOLIC PANEL - Abnormal; Notable for the following:    Chloride 100 (*)    Glucose, Bld 133 (*)    All other components within normal limits  LIPASE, BLOOD  TROPONIN I    Imaging Review US Abdomen Limited  03/01/2015  CLINICAL DATA:  One day history of right upper quadrant pain EXAM: US ABDOMEN LIMITED - RIGHT UPPER QUADRANT COMPARISON:  None. FINDINGS: Gallbladder: There are several 2-3 mm echogenic foci in the gallbladder which neither move nor shadow, likely small polyps. There is mild sludge in the gallbladder. There are no echogenic foci which move and shadow as is expected with gallstones. The gallbladder wall appears subtly edematous with slight thickening. No pericholecystic fluid. Patient is tender over the gallbladder. Common bile duct: Diameter: 3 mm. No intrahepatic or extrahepatic biliary duct dilatation. Liver: No focal lesion identified. Within normal limits in parenchymal echogenicity. IMPRESSION: Gallbladder wall appearance subtly edematous with focal tenderness over the gallbladder. No gallstones are seen. There is mild sludge and probable 2-3 mm scattered polyps in the gallbladder. This overall appearance raises concern for a degree of acalculus cholecystitis. This finding may warrant correlation with nuclear medicine hepatobiliary imaging study to assess for cystic duct patency. Study otherwise unremarkable. Electronically Signed   By:  Bretta Bang III M.D.   On: 03/01/2015 08:42   I have personally reviewed and evaluated these images and lab results as part of my medical decision-making.   With concern for possible acute calculus cholecystitis and discussed the patient's case with our gastroenterology colleagues, surgical colleagues. Patient scheduled for HIDA scan in several hours.    3:10 PM  now, the patient is asymptomatic, and after conversation with her surgical colleagues, the patient is going to follow-up as an outpatient, hida scan has been canceled MDM   Final diagnoses:  Abdominal pain   Patient presents with new upper abdominal pain, right upper quadrant pain. Here, the patient is awake and alert, hemodynamically stable. Patient is a ultrasound evidence concerning for possible biliary etiology. Patient improved substantially here, though with the aforementioned concerns, he had a surgical evaluation, and was scheduled for nuclear medicine scan. Patient's pain remained well controlled, and eventually, this scan was deferred, and the patient will follow-up with surgery for elective cholecystectomy  in the coming days.   Gerhard Munch, MD 03/01/15 [phone]

## 2015-03-01 NOTE — ED Notes (Signed)
Ultrasound at bedside. Labs will be drawn upon completion of ultrasound.

## 2015-03-01 NOTE — ED Notes (Signed)
Patient is alert and oriented x4.  He is complaining of upper abdominal pain that started yesterday.  Patient states that the pain has been intermittent until 11pm last night but the pain has been constant Since then.  Currently he rates his pain 6 of 10 with nausea.

## 2015-03-01 NOTE — Consult Note (Signed)
Reason for Consult: acalculous cholecystitis  Referring Physician: Dr. Kirby Funk   HPI: George Graham is a 60 year old male with a history of HTN, HLD, prediabetes, tobacco use who presents with sudden onset epigastric and ruq abdominal pain which started last night.  Associated with nausea.  denies vomiting.  Denies constipation, diarrhea, melena or hematochezia.  He endorses to changing diet last Sunday which consists of high protein and veggies.  Endorses to mild symptoms in the past, but not necessarily associated with oral intake.  He denies any recent illness. His symptoms have now resolved after 1 dose of fentanyl at 0843.  Denies fever, chills or sweats.  LFTs are normal.  Normal renal function.  No leukocytosis.  Abdominal US demonstrates a edematous  Gallbladder, no gallstones, but mild sludge is noted and probably polyps which are 2-26m in the gallbladder.  This suggested acalculous cholecystitis.  We have therefore been asked to evaluate.  Denies alcohol use.  Denies recent addition of new medication.  Denies recent illness.  1/2ppd smoker.    Past Medical History  Diagnosis Date  . Depression   . Hyperlipidemia   . Tobacco abuse 12/15/2010  . Hypercholesteremia 12/15/2010  . Hard of hearing 12/15/2010  . Angina 12/12  . Hypertension     EKG, CHEST 12/12 EPIC  . Recurrent upper respiratory infection (URI)     "cold vs allergies" 3 weeks ago- states resolved- no fever    Past Surgical History  Procedure Laterality Date  . Colonoscopy    . Cardiac catheterization  12/12    EPIC  . Cardiac catheterization  2012  . Left heart catheterization with coronary angiogram N/A 12/16/2010    Procedure: LEFT HEART CATHETERIZATION WITH CORONARY ANGIOGRAM;  Surgeon: KPixie Casino MD;  [facility];  Service: Cardiovascular;  Laterality: N/A;    Family History  Problem Relation Age of Onset  . Depression Brother   . Coronary artery disease Maternal Grandfather   . Coronary  artery disease Paternal Grandfather   . Prostate cancer Neg Hx   . Colon cancer Neg Hx   . Lung cancer Neg Hx   . Atrial fibrillation Mother   . Cancer Father     Social History:  reports that he has been smoking Cigarettes.  He has a 11.5 pack-year smoking history. He has never used smokeless tobacco. He reports that he does not drink alcohol or use illicit drugs.  Allergies: No Known Allergies  Medications:  Prior to Admission medications   Medication Sig Start Date End Date Taking? Authorizing Provider  aspirin 325 MG tablet Take 325 mg by mouth daily as needed for moderate pain or headache.   Yes Historical Provider, MD  ELDERBERRY PO Take 1 tablet by mouth daily.   Yes Historical Provider, MD  FLUoxetine (PROZAC) 20 MG capsule Take 20 mg by mouth every morning. Take in the mornin   Yes Historical Provider, MD  ibuprofen (ADVIL,MOTRIN) 200 MG tablet Take 200-800 mg by mouth every 8 (eight) hours as needed for headache or moderate pain.    Yes Historical Provider, MD  lisinopril-hydrochlorothiazide (PRINZIDE,ZESTORETIC) 20-25 MG per tablet Take 1 tablet by mouth daily.   Yes Historical Provider, MD     Results for orders placed or performed during the hospital encounter of 03/01/15 (from the past 48 hour(s))  CBC with Differential     Status: Abnormal   Collection Time: 03/01/15  8:44 AM  Result Value Ref Range   WBC 8.4  4.0 - 10.5 K/uL   RBC 4.37 4.22 - 5.81 MIL/uL   Hemoglobin 13.9 13.0 - 17.0 g/dL   HCT 40.6 39.0 - 52.0 %   MCV 92.9 78.0 - 100.0 fL   MCH 31.8 26.0 - 34.0 pg   MCHC 34.2 30.0 - 36.0 g/dL   RDW 12.9 11.5 - 15.5 %   Platelets 262 150 - 400 K/uL   Neutrophils Relative % 86 %   Neutro Abs 7.2 1.7 - 7.7 K/uL   Lymphocytes Relative 7 %   Lymphs Abs 0.5 (L) 0.7 - 4.0 K/uL   Monocytes Relative 7 %   Monocytes Absolute 0.5 0.1 - 1.0 K/uL   Eosinophils Relative 0 %   Eosinophils Absolute 0.0 0.0 - 0.7 K/uL   Basophils Relative 0 %   Basophils Absolute 0.0 0.0 -  0.1 K/uL  Comprehensive metabolic panel     Status: Abnormal   Collection Time: 03/01/15  8:44 AM  Result Value Ref Range   Sodium 136 135 - 145 mmol/L   Potassium 4.0 3.5 - 5.1 mmol/L   Chloride 100 (L) 101 - 111 mmol/L   CO2 25 22 - 32 mmol/L   Glucose, Bld 133 (H) 65 - 99 mg/dL   BUN 20 6 - 20 mg/dL   Creatinine, Ser 0.85 0.61 - 1.24 mg/dL   Calcium 9.3 8.9 - 10.3 mg/dL   Total Protein 8.0 6.5 - 8.1 g/dL   Albumin 4.6 3.5 - 5.0 g/dL   AST 27 15 - 41 U/L   ALT 24 17 - 63 U/L   Alkaline Phosphatase 48 38 - 126 U/L   Total Bilirubin 0.7 0.3 - 1.2 mg/dL   GFR calc non Af Amer >60 >60 mL/min   GFR calc Af Amer >60 >60 mL/min    Comment: (NOTE) The eGFR has been calculated using the CKD EPI equation. This calculation has not been validated in all clinical situations. eGFR's persistently <60 mL/min signify possible Chronic Kidney Disease.    Anion gap 11 5 - 15  Lipase, blood     Status: None   Collection Time: 03/01/15  8:44 AM  Result Value Ref Range   Lipase 32 11 - 51 U/L  Troponin I     Status: None   Collection Time: 03/01/15  8:44 AM  Result Value Ref Range   Troponin I <0.03 <0.031 ng/mL    Comment:        NO INDICATION OF MYOCARDIAL INJURY.     US Abdomen Limited  03/01/2015  CLINICAL DATA:  One day history of right upper quadrant pain EXAM: US ABDOMEN LIMITED - RIGHT UPPER QUADRANT COMPARISON:  None. FINDINGS: Gallbladder: There are several 2-3 mm echogenic foci in the gallbladder which neither move nor shadow, likely small polyps. There is mild sludge in the gallbladder. There are no echogenic foci which move and shadow as is expected with gallstones. The gallbladder wall appears subtly edematous with slight thickening. No pericholecystic fluid. Patient is tender over the gallbladder. Common bile duct: Diameter: 3 mm. No intrahepatic or extrahepatic biliary duct dilatation. Liver: No focal lesion identified. Within normal limits in parenchymal echogenicity.  IMPRESSION: Gallbladder wall appearance subtly edematous with focal tenderness over the gallbladder. No gallstones are seen. There is mild sludge and probable 2-3 mm scattered polyps in the gallbladder. This overall appearance raises concern for a degree of acalculus cholecystitis. This finding may warrant correlation with nuclear medicine hepatobiliary imaging study to assess for cystic duct patency. Study otherwise  unremarkable. Electronically Signed   By: Lowella Grip III M.D.   On: 03/01/2015 08:42    Review of Systems  Constitutional: Positive for malaise/fatigue and diaphoresis. Negative for fever, chills and weight loss.  Eyes: Negative for blurred vision, double vision, photophobia, pain, discharge and redness.  Respiratory: Negative for cough, hemoptysis, sputum production, shortness of breath and wheezing.   Cardiovascular: Negative for chest pain, palpitations, orthopnea, claudication, leg swelling and PND.  Gastrointestinal: Positive for abdominal pain. Negative for heartburn, vomiting, diarrhea, constipation, blood in stool and melena.  Genitourinary: Negative for dysuria, urgency, frequency, hematuria and flank pain.  Musculoskeletal: Negative for myalgias, back pain, joint pain, falls and neck pain.  Neurological: Negative for dizziness, tingling, tremors, sensory change, speech change, focal weakness, loss of consciousness and weakness.  Psychiatric/Behavioral: Negative for suicidal ideas and substance abuse.   Blood pressure 143/79, pulse 68, temperature 98.4 F (36.9 C), temperature source Oral, resp. rate 11, height _0  (1.753 m), weight 85.276 kg (188 lb), SpO2 100 %. Physical Exam  Constitutional: He is oriented to person, place, and time. He appears well-developed and well-nourished. No distress.  Eyes: No scleral icterus.  Bilateral injection   Cardiovascular: Normal rate, regular rhythm, normal heart sounds and intact distal pulses.  Exam reveals no gallop and no  friction rub.   No murmur heard. Respiratory: Effort normal and breath sounds normal. No respiratory distress. He has no wheezes. He has no rales. He exhibits no tenderness.  GI: Soft. Bowel sounds are normal. He exhibits no distension and no mass. There is no tenderness. There is no rebound and no guarding.  Negative murphy's sign.  Umbilical hernia, soft.   Musculoskeletal: Normal range of motion. He exhibits no edema or tenderness.  Neurological: He is alert and oriented to person, place, and time.  Skin: Skin is warm and dry. No rash noted. He is not diaphoretic. No erythema. No pallor.  Psychiatric: He has a normal mood and affect. His behavior is normal. Judgment and thought content normal.    Assessment/Plan: Abdominal pain-US suggests acalculous cholecystitis.  The patient has not had any significant recent illnesses.  He is non tender on exam without leukocytosis and normal LFTs.  Will proceed with a HIDA scan for definitive diagnosis, although my suspicion for cholecystitis is pretty low.  If positive for cholecystitis, then will consider surgery.  He may have biliary dyskinesia, will therefore obtain HIDA with EF, if this is positive, the patient does not need urgent surgery and may follow up on OP basis.  Definitive recommendations to follow pending HIDA results.  Unfortunately, he cannot have the test until about 3PM as fentanyl was given 0843.  The patient is aware of this.   Brentley Landfair ANP-BC Pager [phone]([id number]) 03/01/2015, 11:20 AM

## 2015-03-01 NOTE — Discharge Instructions (Signed)
Please be sure to monitor your condition carefully.  Do not hesitate to return to the ED for any concerning changes in your condition.

## 2015-03-01 NOTE — ED Notes (Signed)
Patient can not go for HIDA scan until 1445 due to narcotic administration at 0845. Provider will be notified.

## 2015-05-31 DIAGNOSIS — I10 Essential (primary) hypertension: Secondary | ICD-10-CM | POA: Diagnosis not present

## 2015-05-31 DIAGNOSIS — F325 Major depressive disorder, single episode, in full remission: Secondary | ICD-10-CM | POA: Diagnosis not present

## 2015-05-31 DIAGNOSIS — E785 Hyperlipidemia, unspecified: Secondary | ICD-10-CM | POA: Diagnosis not present

## 2015-05-31 DIAGNOSIS — E119 Type 2 diabetes mellitus without complications: Secondary | ICD-10-CM | POA: Diagnosis not present

## 2015-07-23 DIAGNOSIS — Z86010 Personal history of colonic polyps: Secondary | ICD-10-CM | POA: Diagnosis not present

## 2015-07-31 DIAGNOSIS — E785 Hyperlipidemia, unspecified: Secondary | ICD-10-CM | POA: Diagnosis not present

## 2015-09-06 DIAGNOSIS — E785 Hyperlipidemia, unspecified: Secondary | ICD-10-CM | POA: Diagnosis not present

## 2015-12-10 DIAGNOSIS — E785 Hyperlipidemia, unspecified: Secondary | ICD-10-CM | POA: Diagnosis not present

## 2015-12-10 DIAGNOSIS — E119 Type 2 diabetes mellitus without complications: Secondary | ICD-10-CM | POA: Diagnosis not present

## 2015-12-10 DIAGNOSIS — F325 Major depressive disorder, single episode, in full remission: Secondary | ICD-10-CM | POA: Diagnosis not present

## 2015-12-10 DIAGNOSIS — I10 Essential (primary) hypertension: Secondary | ICD-10-CM | POA: Diagnosis not present

## 2016-01-17 DIAGNOSIS — Z23 Encounter for immunization: Secondary | ICD-10-CM | POA: Diagnosis not present

## 2016-01-17 DIAGNOSIS — T148XXA Other injury of unspecified body region, initial encounter: Secondary | ICD-10-CM | POA: Diagnosis not present

## 2016-02-25 DIAGNOSIS — M7541 Impingement syndrome of right shoulder: Secondary | ICD-10-CM | POA: Diagnosis not present

## 2016-02-25 DIAGNOSIS — M25512 Pain in left shoulder: Secondary | ICD-10-CM | POA: Diagnosis not present

## 2016-04-09 DIAGNOSIS — M7542 Impingement syndrome of left shoulder: Secondary | ICD-10-CM | POA: Diagnosis not present

## 2016-04-09 DIAGNOSIS — M25512 Pain in left shoulder: Secondary | ICD-10-CM | POA: Diagnosis not present

## 2016-04-21 DIAGNOSIS — M7542 Impingement syndrome of left shoulder: Secondary | ICD-10-CM | POA: Diagnosis not present

## 2016-04-27 DIAGNOSIS — S46012A Strain of muscle(s) and tendon(s) of the rotator cuff of left shoulder, initial encounter: Secondary | ICD-10-CM | POA: Diagnosis not present

## 2016-04-27 DIAGNOSIS — M7542 Impingement syndrome of left shoulder: Secondary | ICD-10-CM | POA: Diagnosis not present

## 2016-04-27 DIAGNOSIS — S43432A Superior glenoid labrum lesion of left shoulder, initial encounter: Secondary | ICD-10-CM | POA: Diagnosis not present

## 2016-05-28 NOTE — Progress Notes (Signed)
Please place orders in EPIC as patient has a pre-op appointment on 06/02/2016! Thank you!

## 2016-05-29 ENCOUNTER — Ambulatory Visit: Payer: Self-pay | Admitting: Specialist

## 2016-05-29 ENCOUNTER — Other Ambulatory Visit (HOSPITAL_COMMUNITY): Payer: Self-pay | Admitting: Emergency Medicine

## 2016-05-29 NOTE — Patient Instructions (Signed)
George Graham  05/29/2016   Your procedure is scheduled on: 06-18-16  [facility]  [place] [place]vators to 3rd floor to  Short Stay Center at 530AM.   Call this number if you have problems the morning of surgery [phone]    Remember: ONLY 1 PERSON MAY GO WITH YOU TO SHORT STAY TO GET  READY MORNING OF YOUR SURGERY.  Do not eat food or drink liquids :After Midnight.     Take these medicines the morning of surgery with A SIP OF WATER: fluoxetine(prozac)                                You may not have any metal on your body including hair pins and              piercings  Do not wear jewelry, make-up, lotions, powders or perfumes, deodorant              Men may shave face and neck.   Do not bring valuables to the hospital.  IS NOT             RESPONSIBLE   FOR VALUABLES.  Contacts, dentures or bridgework may not be worn into surgery.  Leave suitcase in the car. After surgery it may be brought to your room.               Please read over the following fact sheets you were given: _____________________________________________________________________             How to Manage Your Diabetes Before and After Surgery  Why is it important to control my blood sugar before and after surgery? . Improving blood sugar levels before and after surgery helps healing and can limit problems. . A way of improving blood sugar control is eating a healthy diet by: o  Eating less sugar and carbohydrates o  Increasing activity/exercise o  Talking with your doctor about reaching your blood sugar goals . High blood sugars (greater than 180 mg/dL) can raise your risk of infections and slow your recovery, so you will need to focus on controlling your diabetes during the weeks before surgery. . Make sure that the doctor who takes care of your diabetes knows about your planned surgery including the date and location.   Patient  Signature:  Date:   Nurse Signature:  Date:   Reviewed and Endorsed by [facility] Patient Education Committee, August 2015  [facility] - Preparing for [facility] Before surgery, you can play an important role.  Because skin is not sterile, your skin needs to be as free of germs as possible.  You can reduce the number of germs on your skin by washing with CHG (chlorahexidine gluconate) soap before surgery.  CHG is an antiseptic cleaner which kills germs and bonds with the skin to continue killing germs even after washing. Please DO NOT use if you have an allergy to CHG or antibacterial soaps.  If your skin becomes reddened/irritated stop using the CHG and inform your nurse when you arrive at Short Stay. Do not shave (including legs and underarms) for at least 48 hours prior to the first CHG shower.  You may shave your face/neck. Please follow these instructions carefully:  1.  Shower with CHG Soap the night before surgery and the  morning of Surgery.  2.  If you choose to wash your hair, wash your hair first as usual with your  normal  shampoo.  3.  After you shampoo, rinse your hair and body thoroughly to remove the  shampoo.                           4.  Use CHG as you would any other liquid soap.  You can apply chg directly  to the skin and wash                       Gently with a scrungie or clean washcloth.  5.  Apply the CHG Soap to your body ONLY FROM THE NECK DOWN.   Do not use on face/ open                           Wound or open sores. Avoid contact with eyes, ears mouth and genitals (private parts).                       Wash face,  Genitals (private parts) with your normal soap.             6.  Wash thoroughly, paying special attention to the area where your surgery  will be performed.  7.  Thoroughly rinse your body with warm water from the neck down.  8.  DO NOT shower/wash with your normal soap after using and rinsing off  the CHG Soap.                9.  Pat yourself dry with a  clean towel.            10.  Wear clean pajamas.            11.  Place clean sheets on your bed the night of your first shower and do not  sleep with pets. Day of Surgery : Do not apply any lotions/deodorants the morning of surgery.  Please wear clean clothes to the hospital/surgery center.  FAILURE TO FOLLOW THESE INSTRUCTIONS MAY RESULT IN THE CANCELLATION OF YOUR SURGERY  ________________________________________________________________________   Incentive Spirometer  An incentive spirometer is a tool that can help keep your lungs clear and active. This tool measures how well you are filling your lungs with each breath. Taking long deep breaths may help reverse or decrease the chance of developing breathing (pulmonary) problems (especially infection) following:  A long period of time when you are unable to move or be active. BEFORE THE PROCEDURE   If the spirometer includes an indicator to show your best effort, your nurse or respiratory therapist will set it to a desired goal.  If possible, sit up straight or lean slightly forward. Try not to slouch.  Hold the incentive spirometer in an upright position. INSTRUCTIONS FOR USE  1. Sit on the edge of your bed if possible, or sit up as far as you can in bed or on a chair. 2. Hold the incentive spirometer in an upright position. 3. Breathe out normally. 4. Place the mouthpiece in your mouth and seal your lips tightly around it. 5. Breathe in slowly and as deeply as possible, raising the piston or the ball toward the top of the column. 6. Hold your breath for 3-5 seconds or for as long as possible. Allow the piston or ball  to fall to the bottom of the column. 7. Remove the mouthpiece from your mouth and breathe out normally. 8. Rest for a few seconds and repeat Steps 1 through 7 at least 10 times every 1-2 hours when you are awake. Take your time and take a few normal breaths between deep breaths. 9. The spirometer may include an  indicator to show your best effort. Use the indicator as a goal to work toward during each repetition. 10. After each set of 10 deep breaths, practice coughing to be sure your lungs are clear. If you have an incision (the cut made at the time of surgery), support your incision when coughing by placing a pillow or rolled up towels firmly against it. Once you are able to get out of bed, walk around indoors and cough well. You may stop using the incentive spirometer when instructed by your caregiver.  RISKS AND COMPLICATIONS  Take your time so you do not get dizzy or light-headed.  If you are in pain, you may need to take or ask for pain medication before doing incentive spirometry. It is harder to take a deep breath if you are having pain. AFTER USE  Rest and breathe slowly and easily.  It can be helpful to keep track of a log of your progress. Your caregiver can provide you with a simple table to help with this. If you are using the spirometer at home, follow these instructions: SEEK MEDICAL CARE IF:   You are having difficultly using the spirometer.  You have trouble using the spirometer as often as instructed.  Your pain medication is not giving enough relief while using the spirometer.  You develop fever of 100.5 F (38.1 C) or higher. SEEK IMMEDIATE MEDICAL CARE IF:   You cough up bloody sputum that had not been present before.  You develop fever of 102 F (38.9 C) or greater.  You develop worsening pain at or near the incision site. MAKE SURE YOU:   Understand these instructions.  Will watch your condition.  Will get help right away if you are not doing well or get worse. Document Released: 05/11/2006 Document Revised: 03/23/2011 Document Reviewed: 07/12/2006 [facility] Patient Information 2014 [place], [place].   ________________________________________________________________________

## 2016-06-02 ENCOUNTER — Encounter (HOSPITAL_COMMUNITY): Payer: Self-pay

## 2016-06-02 ENCOUNTER — Encounter (HOSPITAL_COMMUNITY)
Admission: RE | Admit: 2016-06-02 | Discharge: 2016-06-02 | Disposition: A | Discharge status: Home / Self Care | Payer: BLUE CROSS/BLUE SHIELD | Source: Ambulatory Visit | Attending: Specialist | Admitting: Specialist

## 2016-06-02 DIAGNOSIS — H9190 Unspecified hearing loss, unspecified ear: Secondary | ICD-10-CM | POA: Insufficient documentation

## 2016-06-02 DIAGNOSIS — Z720 Tobacco use: Secondary | ICD-10-CM | POA: Diagnosis not present

## 2016-06-02 DIAGNOSIS — I10 Essential (primary) hypertension: Secondary | ICD-10-CM | POA: Diagnosis not present

## 2016-06-02 DIAGNOSIS — Z01810 Encounter for preprocedural cardiovascular examination: Secondary | ICD-10-CM | POA: Diagnosis not present

## 2016-06-02 DIAGNOSIS — E7800 Pure hypercholesterolemia, unspecified: Secondary | ICD-10-CM | POA: Diagnosis not present

## 2016-06-02 DIAGNOSIS — Z01812 Encounter for preprocedural laboratory examination: Secondary | ICD-10-CM | POA: Insufficient documentation

## 2016-06-02 DIAGNOSIS — F329 Major depressive disorder, single episode, unspecified: Secondary | ICD-10-CM | POA: Diagnosis not present

## 2016-06-02 LAB — CBC
HCT: 40.4 % (ref 39.0–52.0)
Hemoglobin: 13.6 g/dL (ref 13.0–17.0)
MCH: 31.6 pg (ref 26.0–34.0)
MCHC: 33.7 g/dL (ref 30.0–36.0)
MCV: 94 fL (ref 78.0–100.0)
PLATELETS: 291 10*3/uL (ref 150–400)
RBC: 4.3 MIL/uL (ref 4.22–5.81)
RDW: 12.9 % (ref 11.5–15.5)
WBC: 6.9 10*3/uL (ref 4.0–10.5)

## 2016-06-02 LAB — BASIC METABOLIC PANEL
Anion gap: 10 (ref 5–15)
BUN: 17 mg/dL (ref 6–20)
CALCIUM: 9.6 mg/dL (ref 8.9–10.3)
CO2: 29 mmol/L (ref 22–32)
CREATININE: 0.83 mg/dL (ref 0.61–1.24)
Chloride: 100 mmol/L — ABNORMAL LOW (ref 101–111)
GFR calc non Af Amer: >60 mL/min (ref >60)
GLUCOSE: 101 mg/dL — AB (ref 65–99)
Potassium: 4.4 mmol/L (ref 3.5–5.1)
Sodium: 139 mmol/L (ref 135–145)

## 2016-06-02 LAB — SURGICAL PCR SCREEN
MRSA, PCR: NEGATIVE
Staphylococcus aureus: NEGATIVE

## 2016-06-04 LAB — HEMOGLOBIN A1C
HEMOGLOBIN A1C: 6.1 % — AB (ref 4.8–5.6)
Mean Plasma Glucose: 128 mg/dL

## 2016-06-16 NOTE — H&P (Signed)
George Graham is an 61 y.o. male.   Chief Complaint: leftshoulder pain HPI:  Patient notes persistent  left shoulder pain. they are 4 month(s) out from when symptoms began. Symptoms reported today include: pain, aching, pain with overhead motions and burning. Current treatment includes: activity modification and NSAIDs. The following medication has been used for pain control: antiinflammatory medication (prn ibuprofen).   MRI: findings show full-thickness tear of the rotator cuff and severe thinning of the subscap, complete tear of the long head of the biceps and mild AC arthrosis and glenohumeral arthrosis.   Patient has failed conservative treatment.  It is felt at this time they would benefit from surgical intervention.  Risks and benefits of surgery discussed with patient and they wish to proceed. Past Medical History:  Diagnosis Date  . Angina 12/12  . Depression   . Hard of hearing 12/15/2010   hear bette ron right side   . Hypercholesteremia 12/15/2010  . Hyperlipidemia   . Hypertension    EKG, CHEST 12/12 EPIC  . Recurrent upper respiratory infection (URI)    "cold vs allergies" 3 weeks ago- states resolved- no fever  . Tobacco abuse 12/15/2010    Past Surgical History:  Procedure Laterality Date  . CARDIAC CATHETERIZATION  12/12   EPIC  . CARDIAC CATHETERIZATION  2012  . COLONOSCOPY    . LEFT HEART CATHETERIZATION WITH CORONARY ANGIOGRAM N/A 12/16/2010   Procedure: LEFT HEART CATHETERIZATION WITH CORONARY ANGIOGRAM;  Surgeon: Chrystie Nose, MD;  Location: [facility] CATH LAB;  Service: Cardiovascular;  Laterality: N/A;    Family History  Problem Relation Age of Onset  . Coronary artery disease Maternal Grandfather   . Coronary artery disease Paternal Grandfather   . Atrial fibrillation Mother   . Cancer Father   . Depression Brother   . Prostate cancer Neg Hx   . Colon cancer Neg Hx   . Lung cancer Neg Hx    Social History:  reports that he quit smoking about 17 months  ago. His smoking use included Cigarettes. He has a 11.50 pack-year smoking history. He has never used smokeless tobacco. He reports that he does not drink alcohol or use drugs.  Allergies: No Known Allergies  No prescriptions prior to admission.    No results found for this or any previous visit (from the past 48 hour(s)). No results found.  Review of Systems: The patient denies any fever, chills, night sweats, or bleeding tendencies. CNS: No blurred double vision, seizure, headache, paralysis. RESPIRATORY: No shortness of breath, productive cough, or hemoptysis. CARDIOVASCULAR: No chest pain, angina, or orthopnea, GU: No dysuria, hematuria, or discharge. MUSCULOSKELETAL: Pertinent per HPI.  There were no vitals taken for this visit.  GENERAL: Patient is a 61 y.o. male, well-nourished, well-developed, no acute distress. Alert, oriented, cooperative. HENT:  Normocephalic, atraumatic. Pupils round and reactive. EOMs intact. NECK:  Supple, no bruits. CHEST:  Clear to anterior and posterior chest walls. No rhonchi, rales, wheezes. HEART:  Regular, rate and rhythm.  No murmurs.  S1 and S2 noted. ABDOMEN:  Soft, nontender, bowel sounds present. RECTAL/BREAST/GENITALIA:  Not done, not pertinent to present illness. EXTREMITIES:  On exam, positive impingement sign. Tender in the anterior subacromial region. Positive drop-arm sign. Weak in external rotation. Nontender over the [facility].  Inspection of the shoulder revealed no ecchymosis, soft tissue swelling or deformity. Provocative signs indicated no sulcus sign, and negative Speed's test. Negative lift off. Sensory exam was intact and motor function was normal in the  deltoid and the rotator cuff.  Assessment/Plan left shoulder  pain secondary to rotator cuff tear. 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 Lavage the glenohumeral joint, followed by a mini-open rotator cuff repair. I had a long discussion with the patient concerning the risks and benefits of a rotator cuff repair, including bleeding, infection, prolonged postoperative recovery, which may require 3 to 5 months until maximum medical improvement. Overnight procedure with initiation of early passive range of motion within physical therapy. Avoid any active motion for the first six weeks. This is all in an effort to avoid recurrent tear of the rotator cuff and adhesive capsulitis. Return to work without use of the arm can be obtained following two weeks. However, driving will be a challenge. We also discussed the possibility of requiring implants including bone anchors, as well as an Allograft patch graft if a massive rotator cuff tear is encountered. Removal of any bones for spurs as well as bursitis will be performed during the procedure and also any associated anesthetic complications as well.  He is able to take Kefzol, no history of MRSA. He is also able to take Percocet. He works as a Charity fundraiserchemist. He can work with a sling early, does not have to reach or lift. Otherwise healthy.  BLAIR ROBERTS 06/16/2016, 5:01 PM

## 2016-06-16 NOTE — Anesthesia Preprocedure Evaluation (Signed)
Anesthesia Evaluation  Patient identified by MRN, date of birth, ID band Patient awake    Reviewed: Allergy & Precautions, NPO status , Patient's Chart, lab work & pertinent test results  Airway Mallampati: II  TM Distance: >3 FB Neck ROM: Full    Dental no notable dental hx.    Pulmonary neg pulmonary ROS, former smoker,    Pulmonary exam normal breath sounds clear to auscultation       Cardiovascular hypertension, Normal cardiovascular exam Rhythm:Regular Rate:Normal     Neuro/Psych negative neurological ROS  negative psych ROS   GI/Hepatic negative GI ROS, Neg liver ROS,   Endo/Other  negative endocrine ROS  Renal/GU negative Renal ROS  negative genitourinary   Musculoskeletal negative musculoskeletal ROS (+)   Abdominal   Peds negative pediatric ROS (+)  Hematology negative hematology ROS (+)   Anesthesia Other Findings   Reproductive/Obstetrics negative OB ROS                             Anesthesia Physical Anesthesia Plan  ASA: II  Anesthesia Plan: General   Post-op Pain Management: GA combined w/ Regional for post-op pain   Induction: Intravenous  PONV Risk Score and Plan: 2 and Ondansetron, Dexamethasone and Treatment may vary due to age  Airway Management Planned: Oral ETT  Additional Equipment:   Intra-op Plan:   Post-operative Plan: Extubation in OR  Informed Consent: I have reviewed the patients History and Physical, chart, labs and discussed the procedure including the risks, benefits and alternatives for the proposed anesthesia with the patient or authorized representative who has indicated his/her understanding and acceptance.   Dental advisory given  Plan Discussed with: CRNA and Surgeon  Anesthesia Plan Comments:         Anesthesia Quick Evaluation

## 2016-06-18 ENCOUNTER — Ambulatory Visit (HOSPITAL_COMMUNITY)
Admission: RE | Admit: 2016-06-18 | Discharge: 2016-06-18 | Disposition: A | Discharge status: Home / Self Care | Payer: BLUE CROSS/BLUE SHIELD | Source: Ambulatory Visit | Attending: Specialist | Admitting: Specialist

## 2016-06-18 ENCOUNTER — Encounter (HOSPITAL_COMMUNITY): Payer: Self-pay | Admitting: *Deleted

## 2016-06-18 ENCOUNTER — Ambulatory Visit (HOSPITAL_COMMUNITY): Payer: BLUE CROSS/BLUE SHIELD | Admitting: Anesthesiology

## 2016-06-18 ENCOUNTER — Encounter (HOSPITAL_COMMUNITY)
Admission: RE | Disposition: A | Discharge status: Home / Self Care | Payer: Self-pay | Source: Ambulatory Visit | Attending: Specialist

## 2016-06-18 DIAGNOSIS — F329 Major depressive disorder, single episode, unspecified: Secondary | ICD-10-CM | POA: Diagnosis not present

## 2016-06-18 DIAGNOSIS — I10 Essential (primary) hypertension: Secondary | ICD-10-CM | POA: Insufficient documentation

## 2016-06-18 DIAGNOSIS — M19012 Primary osteoarthritis, left shoulder: Secondary | ICD-10-CM | POA: Diagnosis not present

## 2016-06-18 DIAGNOSIS — S43432A Superior glenoid labrum lesion of left shoulder, initial encounter: Secondary | ICD-10-CM | POA: Diagnosis not present

## 2016-06-18 DIAGNOSIS — M7542 Impingement syndrome of left shoulder: Secondary | ICD-10-CM | POA: Insufficient documentation

## 2016-06-18 DIAGNOSIS — G8918 Other acute postprocedural pain: Secondary | ICD-10-CM | POA: Diagnosis not present

## 2016-06-18 DIAGNOSIS — E7800 Pure hypercholesterolemia, unspecified: Secondary | ICD-10-CM | POA: Diagnosis not present

## 2016-06-18 DIAGNOSIS — Z87891 Personal history of nicotine dependence: Secondary | ICD-10-CM | POA: Insufficient documentation

## 2016-06-18 DIAGNOSIS — M75102 Unspecified rotator cuff tear or rupture of left shoulder, not specified as traumatic: Secondary | ICD-10-CM | POA: Diagnosis not present

## 2016-06-18 DIAGNOSIS — M75122 Complete rotator cuff tear or rupture of left shoulder, not specified as traumatic: Secondary | ICD-10-CM | POA: Diagnosis not present

## 2016-06-18 HISTORY — PX: SHOULDER ARTHROSCOPY WITH ROTATOR CUFF REPAIR AND SUBACROMIAL DECOMPRESSION: SHX5686

## 2016-06-18 LAB — GLUCOSE, CAPILLARY
GLUCOSE-CAPILLARY: 136 mg/dL — AB (ref 65–99)
Glucose-Capillary: 147 mg/dL — ABNORMAL HIGH (ref 65–99)

## 2016-06-18 SURGERY — SHOULDER ARTHROSCOPY WITH ROTATOR CUFF REPAIR AND SUBACROMIAL DECOMPRESSION
Anesthesia: General | Site: Shoulder | Laterality: Left

## 2016-06-18 MED ORDER — PROMETHAZINE HCL 25 MG/ML IJ SOLN: 6.2500 mg | INTRAMUSCULAR | Status: DC | PRN

## 2016-06-18 MED ORDER — CEFAZOLIN SODIUM-DEXTROSE 2-4 GM/100ML-% IV SOLN
2.0000 g | INTRAVENOUS | Status: AC
  Administered 2016-06-18: 2 g via INTRAVENOUS

## 2016-06-18 MED ORDER — DEXAMETHASONE SODIUM PHOSPHATE 10 MG/ML IJ SOLN
INTRAMUSCULAR | Status: DC | PRN
  Administered 2016-06-18: 10 mg via INTRAVENOUS

## 2016-06-18 MED ORDER — ONDANSETRON HCL 4 MG/2ML IJ SOLN
INTRAMUSCULAR | Status: DC | PRN
  Administered 2016-06-18: 4 mg via INTRAVENOUS

## 2016-06-18 MED ORDER — DEXAMETHASONE SODIUM PHOSPHATE 10 MG/ML IJ SOLN
INTRAMUSCULAR | Status: AC
  Filled 2016-06-18: qty 1

## 2016-06-18 MED ORDER — KETOROLAC TROMETHAMINE 15 MG/ML IJ SOLN
INTRAMUSCULAR | Status: DC | PRN
  Administered 2016-06-18: 15 mg via INTRAVENOUS

## 2016-06-18 MED ORDER — EPINEPHRINE PF 1 MG/ML IJ SOLN
INTRAMUSCULAR | Status: AC
  Filled 2016-06-18: qty 2

## 2016-06-18 MED ORDER — PROPOFOL 10 MG/ML IV BOLUS
INTRAVENOUS | Status: DC | PRN
  Administered 2016-06-18: 100 mg via INTRAVENOUS

## 2016-06-18 MED ORDER — SUGAMMADEX SODIUM 200 MG/2ML IV SOLN
INTRAVENOUS | Status: AC
  Filled 2016-06-18: qty 2

## 2016-06-18 MED ORDER — IBUPROFEN 800 MG PO TABS: 800.0000 mg | ORAL_TABLET | Freq: Three times a day (TID) | ORAL | 1 refills | Status: DC | PRN

## 2016-06-18 MED ORDER — CHLORHEXIDINE GLUCONATE 4 % EX LIQD: 60.0000 mL | Freq: Once | CUTANEOUS | Status: DC

## 2016-06-18 MED ORDER — SUGAMMADEX SODIUM 200 MG/2ML IV SOLN
INTRAVENOUS | Status: DC | PRN
  Administered 2016-06-18: 200 mg via INTRAVENOUS

## 2016-06-18 MED ORDER — BUPIVACAINE-EPINEPHRINE 0.5% -1:200000 IJ SOLN
INTRAMUSCULAR | Status: DC | PRN
  Administered 2016-06-18: 10 mL

## 2016-06-18 MED ORDER — SUCCINYLCHOLINE CHLORIDE 200 MG/10ML IV SOSY
PREFILLED_SYRINGE | INTRAVENOUS | Status: AC
  Filled 2016-06-18: qty 10

## 2016-06-18 MED ORDER — LIDOCAINE 2% (20 MG/ML) 5 ML SYRINGE
INTRAMUSCULAR | Status: AC
  Filled 2016-06-18: qty 5

## 2016-06-18 MED ORDER — PROPOFOL 10 MG/ML IV BOLUS
INTRAVENOUS | Status: AC
  Filled 2016-06-18: qty 20

## 2016-06-18 MED ORDER — MIDAZOLAM HCL 2 MG/2ML IJ SOLN
INTRAMUSCULAR | Status: DC | PRN
  Administered 2016-06-18 (×2): 1 mg via INTRAVENOUS

## 2016-06-18 MED ORDER — CEPHALEXIN 500 MG PO CAPS: 500.0000 mg | ORAL_CAPSULE | Freq: Four times a day (QID) | ORAL | 1 refills | Status: DC

## 2016-06-18 MED ORDER — ONDANSETRON HCL 4 MG/2ML IJ SOLN
INTRAMUSCULAR | Status: AC
  Filled 2016-06-18: qty 2

## 2016-06-18 MED ORDER — EPHEDRINE SULFATE-NACL 50-0.9 MG/10ML-% IV SOSY
PREFILLED_SYRINGE | INTRAVENOUS | Status: DC | PRN
  Administered 2016-06-18 (×2): 5 mg via INTRAVENOUS

## 2016-06-18 MED ORDER — FENTANYL CITRATE (PF) 250 MCG/5ML IJ SOLN
INTRAMUSCULAR | Status: AC
  Filled 2016-06-18: qty 5

## 2016-06-18 MED ORDER — SODIUM CHLORIDE 0.9 % IR SOLN
Status: AC
  Filled 2016-06-18: qty 500000

## 2016-06-18 MED ORDER — SODIUM CHLORIDE 0.9 % IR SOLN
Status: DC | PRN
  Administered 2016-06-18: 500 mL

## 2016-06-18 MED ORDER — METHOCARBAMOL 500 MG PO TABS: 500.0000 mg | ORAL_TABLET | Freq: Four times a day (QID) | ORAL | 1 refills | Status: DC | PRN

## 2016-06-18 MED ORDER — BUPIVACAINE-EPINEPHRINE (PF) 0.5% -1:200000 IJ SOLN
INTRAMUSCULAR | Status: DC | PRN
  Administered 2016-06-18: 20 mL via PERINEURAL

## 2016-06-18 MED ORDER — BUPIVACAINE-EPINEPHRINE (PF) 0.5% -1:200000 IJ SOLN
INTRAMUSCULAR | Status: AC
  Filled 2016-06-18: qty 30

## 2016-06-18 MED ORDER — OXYCODONE-ACETAMINOPHEN 5-325 MG PO TABS: 1.0000 | ORAL_TABLET | ORAL | 0 refills | Status: DC | PRN

## 2016-06-18 MED ORDER — DOCUSATE SODIUM 100 MG PO CAPS: 100.0000 mg | ORAL_CAPSULE | Freq: Two times a day (BID) | ORAL | 1 refills | Status: DC | PRN

## 2016-06-18 MED ORDER — EPINEPHRINE PF 1 MG/ML IJ SOLN
INTRAMUSCULAR | Status: DC | PRN
  Administered 2016-06-18: 1 mg

## 2016-06-18 MED ORDER — KETOROLAC TROMETHAMINE 30 MG/ML IJ SOLN
INTRAMUSCULAR | Status: AC
  Filled 2016-06-18: qty 1

## 2016-06-18 MED ORDER — LIDOCAINE 2% (20 MG/ML) 5 ML SYRINGE
INTRAMUSCULAR | Status: DC | PRN
  Administered 2016-06-18: 100 mg via INTRAVENOUS

## 2016-06-18 MED ORDER — CEFAZOLIN SODIUM-DEXTROSE 2-4 GM/100ML-% IV SOLN
INTRAVENOUS | Status: AC
  Filled 2016-06-18: qty 100

## 2016-06-18 MED ORDER — FENTANYL CITRATE (PF) 100 MCG/2ML IJ SOLN: 25.0000 ug | INTRAMUSCULAR | Status: DC | PRN

## 2016-06-18 MED ORDER — ROCURONIUM BROMIDE 50 MG/5ML IV SOSY
PREFILLED_SYRINGE | INTRAVENOUS | Status: AC
  Filled 2016-06-18: qty 5

## 2016-06-18 MED ORDER — LACTATED RINGERS IV SOLN
INTRAVENOUS | Status: DC
  Administered 2016-06-18 (×2): via INTRAVENOUS

## 2016-06-18 MED ORDER — MIDAZOLAM HCL 2 MG/2ML IJ SOLN
INTRAMUSCULAR | Status: AC
  Filled 2016-06-18: qty 2

## 2016-06-18 MED ORDER — FENTANYL CITRATE (PF) 100 MCG/2ML IJ SOLN
INTRAMUSCULAR | Status: DC | PRN
  Administered 2016-06-18: 50 ug via INTRAVENOUS
  Administered 2016-06-18: 100 ug via INTRAVENOUS
  Administered 2016-06-18: 50 ug via INTRAVENOUS

## 2016-06-18 MED ORDER — ROCURONIUM BROMIDE 10 MG/ML (PF) SYRINGE
PREFILLED_SYRINGE | INTRAVENOUS | Status: DC | PRN
  Administered 2016-06-18: 50 mg via INTRAVENOUS

## 2016-06-18 MED ORDER — EPHEDRINE 5 MG/ML INJ
INTRAVENOUS | Status: AC
  Filled 2016-06-18: qty 10

## 2016-06-18 MED ORDER — LACTATED RINGERS IR SOLN
Status: DC | PRN
  Administered 2016-06-18: 6000 mL

## 2016-06-18 SURGICAL SUPPLY — 57 items
ANCH SUT SWLK 19.1X4.75 VT (Anchor) ×2 IMPLANT
ANCHOR NDL 9/16 CIR SZ 8 (NEEDLE) IMPLANT
ANCHOR NEEDLE 9/16 CIR SZ 8 (NEEDLE) IMPLANT
ANCHOR PEEK 4.75X19.1 SWLK C (Anchor) ×2 IMPLANT
BLADE CUDA SHAVER 3.5 (BLADE) ×2 IMPLANT
BLADE SURG SZ11 CARB STEEL (BLADE) ×2 IMPLANT
BUR OVAL 4.0 (BURR) IMPLANT
CANNULA ACUFO 5X76 (CANNULA) ×2 IMPLANT
CLEANER TIP ELECTROSURG 2X2 (MISCELLANEOUS) IMPLANT
CLOTH 2% CHLOROHEXIDINE 3PK (PERSONAL CARE ITEMS) ×2 IMPLANT
COVER SURGICAL LIGHT HANDLE (MISCELLANEOUS) ×2 IMPLANT
DRAPE ORTHO SPLIT 77X108 STRL (DRAPES) ×2
DRAPE POUCH INSTRU U-SHP 10X18 (DRAPES) IMPLANT
DRAPE STERI 35X30 U-POUCH (DRAPES) ×2 IMPLANT
DRAPE SURG ORHT 6 SPLT 77X108 (DRAPES) ×1 IMPLANT
DRSG AQUACEL AG ADV 3.5X 4 (GAUZE/BANDAGES/DRESSINGS) IMPLANT
DRSG AQUACEL AG ADV 3.5X 6 (GAUZE/BANDAGES/DRESSINGS) IMPLANT
DRSG PAD ABDOMINAL 8X10 ST (GAUZE/BANDAGES/DRESSINGS) IMPLANT
DURAPREP 26ML APPLICATOR (WOUND CARE) ×2 IMPLANT
ELECT NDL TIP 2.8 STRL (NEEDLE) IMPLANT
ELECT NEEDLE TIP 2.8 STRL (NEEDLE) ×2 IMPLANT
ELECT REM PT RETURN 15FT ADLT (MISCELLANEOUS) ×2 IMPLANT
GAUZE SPONGE 4X4 12PLY STRL (GAUZE/BANDAGES/DRESSINGS) ×1 IMPLANT
GAUZE XEROFORM 1X8 LF (GAUZE/BANDAGES/DRESSINGS) ×1 IMPLANT
GLOVE BIOGEL PI IND STRL 7.0 (GLOVE) ×1 IMPLANT
GLOVE BIOGEL PI INDICATOR 7.0 (GLOVE) ×1
GLOVE SURG SS PI 7.0 STRL IVOR (GLOVE) ×2 IMPLANT
GLOVE SURG SS PI 7.5 STRL IVOR (GLOVE) ×2 IMPLANT
GLOVE SURG SS PI 8.0 STRL IVOR (GLOVE) ×4 IMPLANT
GOWN STRL REUS W/TWL XL LVL3 (GOWN DISPOSABLE) ×4 IMPLANT
KIT BASIN OR (CUSTOM PROCEDURE TRAY) ×2 IMPLANT
KIT POSITION SHOULDER SCHLEI (MISCELLANEOUS) ×2 IMPLANT
MANIFOLD NEPTUNE II (INSTRUMENTS) ×2 IMPLANT
NDL SCORPION MULTI FIRE (NEEDLE) IMPLANT
NDL SPNL 18GX3.5 QUINCKE PK (NEEDLE) ×1 IMPLANT
NEEDLE SCORPION MULTI FIRE (NEEDLE) ×2 IMPLANT
NEEDLE SPNL 18GX3.5 QUINCKE PK (NEEDLE) ×2 IMPLANT
PACK SHOULDER (CUSTOM PROCEDURE TRAY) ×2 IMPLANT
PAD ABD 8X10 STRL (GAUZE/BANDAGES/DRESSINGS) ×2 IMPLANT
POSITIONER SURGICAL ARM (MISCELLANEOUS) ×2 IMPLANT
SLING ARM IMMOBILIZER LRG (SOFTGOODS) IMPLANT
SLING ARM IMMOBILIZER MED (SOFTGOODS) IMPLANT
SLING ULTRA II L (ORTHOPEDIC SUPPLIES) IMPLANT
STRIP CLOSURE SKIN 1/2X4 (GAUZE/BANDAGES/DRESSINGS) ×1 IMPLANT
SUT ETHIBOND NAB CT1 #1 30IN (SUTURE) IMPLANT
SUT ETHILON 4 0 PS 2 18 (SUTURE) IMPLANT
SUT FIBERWIRE #2 38 T-5 BLUE (SUTURE)
SUT PROLENE 3 0 PS 2 (SUTURE) IMPLANT
SUT TIGER TAPE 7 IN WHITE (SUTURE) IMPLANT
SUT VIC AB 1-0 CT2 27 (SUTURE) IMPLANT
SUT VIC AB 2-0 CT2 27 (SUTURE) IMPLANT
SUTURE FIBERWR #2 38 T-5 BLUE (SUTURE) IMPLANT
TAPE FIBER 2MM 7IN #2 BLUE (SUTURE) ×1 IMPLANT
TOWEL OR 17X26 10 PK STRL BLUE (TOWEL DISPOSABLE) ×2 IMPLANT
TUBING ARTHRO INFLOW-ONLY STRL (TUBING) ×2 IMPLANT
TUBING CONNECTING 10 (TUBING) IMPLANT
WAND HAND CNTRL MULTIVAC 90 (MISCELLANEOUS) IMPLANT

## 2016-06-18 NOTE — Anesthesia Procedure Notes (Signed)
Anesthesia Regional Block: Interscalene brachial plexus block   Pre-Anesthetic Checklist: ,, timeout performed, Correct Patient, Correct Site, Correct Laterality, Correct Procedure, Correct Position, site marked, Risks and benefits discussed,  Surgical consent,  Pre-op evaluation,  At surgeon's request and post-op pain management  Laterality: Left  Prep: chloraprep       Needles:  Injection technique: Single-shot  Needle Type: Echogenic Needle     Needle Length: 9cm  Needle Gauge: 21     Additional Needles:   Procedures: ultrasound guided,,,,,,,,  Narrative:  Start time: 06/18/2016 7:15 AM End time: 06/18/2016 7:20 AM Injection made incrementally with aspirations every 5 mL.  Performed by: Personally  Anesthesiologist: Shona SimpsonHOLLIS, KEVIN D  Additional Notes: Tolerated well.

## 2016-06-18 NOTE — Discharge Instructions (Signed)
Aquacel dressing may remain in place until follow up. May shower with aquacel dressing in place. If the dressing becomes saturated or peels off, you may remove it and place a new dressing with gauze and tape which should be kept clean and dry and changed daily. °Use sling at times except when exercising or showering °No driving for 4-6 weeks °No lifting for 6 weeks operative arm °Pendulum exercises as instructed. °Ok to move wrist, elbow, and hand. °See Dr. Ilean Spradlin in 10-14 days. Take one aspirin per day with a meal if not on a blood thinner or allergic to aspirin. °

## 2016-06-18 NOTE — Transfer of Care (Signed)
Immediate Anesthesia Transfer of Care Note  Patient: George Graham  Procedure(s) Performed: Procedure(s) with comments: Left shouler arthroscopy, subacromial decompression, mini open rotator cuff repair (Left) - 2 hrs  Patient Location: PACU  Anesthesia Type:GA combined with regional for post-op pain  Level of Consciousness: awake, alert  and oriented  Airway & Oxygen Therapy: Patient Spontanous Breathing and Patient connected to face mask oxygen  Post-op Assessment: Report given to RN and Post -op Vital signs reviewed and stable  Post vital signs: Reviewed and stable  Last Vitals: There were no vitals filed for this visit.  Last Pain:  Vitals:   06/18/16 0534  PainSc: 3       Patients Stated Pain Goal: 4 (06/18/16 0534)  Complications: No apparent anesthesia complications

## 2016-06-18 NOTE — Interval H&P Note (Signed)
History and Physical Interval Note:  06/18/2016 7:11 AM  George Graham  has presented today for surgery, with the diagnosis of Rotator cuff tear, labral tear left shoulder  The various methods of treatment have been discussed with the patient and family. After consideration of risks, benefits and other options for treatment, the patient has consented to  Procedure(s) with comments: Left shouler arthroscopy, subacromial decompression, mini open rotator cuff repair (Left) - 2 hrs as a surgical intervention .  The patient's history has been reviewed, patient examined, no change in status, stable for surgery.  I have reviewed the patient's chart and labs.  Questions were answered to the patient's satisfaction.     Rianna Lukes C

## 2016-06-18 NOTE — Op Note (Signed)
George Graham, George Graham NO.:  [id number]  MEDICAL RECORD NO.:  [id number]  LOCATION:                                 FACILITY:  PHYSICIAN:  Jene Every, M.D.         DATE OF BIRTH:  DATE OF PROCEDURE:  06/18/2016 DATE OF DISCHARGE:                              OPERATIVE REPORT   PREOPERATIVE DIAGNOSIS:  Rotator cuff tear, impingement syndrome, labral tear, biceps tendon tear of left shoulder.  POSTOPERATIVE DIAGNOSIS:  Rotator cuff tear, impingement syndrome, labral tear, biceps tendon tear of left shoulder.  PROCEDURES PERFORMED: 1. Exam under anesthesia. 2. Left shoulder arthroscopy with debridement of biceps tendon and     labral tear. 3. Subacromial decompression, acromioplasty, bursectomy. 4. Mini open rotator cuff repair with PushLock anchors x2.  ANESTHESIA:  General.  ASSISTANT:  Skip Mayer, PA.  HISTORY:  The patient with rotator cuff tear, retracted, history of biceps tendon tear, labral tear, indicated for repair of the rotator cuff with full-thickness, retracted. Risk and benefits discussed including bleeding, infection, damage to neurovascular structures, no changes in symptoms, worsening symptoms of DVT/PE, anesthetic complications, etc.  TECHNIQUE:  With the patient in supine beach-chair position, after induction of adequate general anesthesia, 2 g of Kefzol, and a supraclavicular regional block, the left upper extremity and shoulder was prepped and draped in the usual sterile fashion.  A surgical marker was utilized to delineate the acromion, AC joint, and coracoid.  A standard posterolateral portal was utilized with an incision through the skin only with an 11-blade.  With the arm in the 70/30 position, we advanced the arthroscopic cannula in line with the coracoid penetrating the capsule atraumatically.  65 mmHg irrigant used to insufflate the joint.  Then, midway between the coracoid and the anterolateral aspect of the acromion,  with palpation, distal medial aspect of the [facility] joint, engaged an 18-gauge needle into the glenohumeral joint just below the biceps tendon stump.  This was after we evaluated the joint.  There was some minor glenohumeral arthrosis, tearing of the subscap, a supraspinatus tear, as well as, a labral tear and a remnant of the biceps tendon.  We then advanced the cannula after removing the 18-gauge needle in line colinear with that penetrating just below the biceps tendon stump and the glenohumeral joint.  We introduced a shaver and debrided the labrum to a stable base, it was anterior, superior, and posterior, and debrided the remnant of the biceps tendon.  The tear of the supraspinatus was noted.  We debrided the glenohumeral joint.  Next, we then redirected the camera into the subacromial space.  An anterolateral portal was utilized, with incision through the skin only. We triangulated in the subacromial space with the arm in 0-30 position. Exuberant bursa was noted.  We introduced a shaver and performed a bursectomy.  Type 1 acromion was encountered. We found the tear, full- thickness, and converted to a mini open rotator cuff.  We removed all arthroscopic equipments, closed the portals with 4-0 nylon simple sutures.  Made a 2 cm incision over the anterior lateral aspect of the acromion.  Identified the raphe between the anterior and lateral heads of the  deltoid, divided in line with skin incision.  Self-retaining retractor was placed.  We preserved the deltoid attachment.  We partially released the CA ligament.  Digitally, lysed adhesions in the subacromial joint, resected, the residual bursa encountered.  Inspected and found the tear. The supraspinatus was retracted approximately 1.5 cm.  There was a thin film of bursa overlying that, we excised this. We fashioned a trough with a Beyer rongeur lateral to the articular surface, mobilized the tendon on its articular and bursal side with  an Allis.  We then used 2 PushLocks. We fashioned a pilot hole with an awl and then used Tiger tape, inserted the Tiger tape through the PushLock, inserted into the pilot hole, engaged it. We had excellent resistance to pull out. With a Scorpion suture passer, we passed it through the supraspinatus, crossed it, and pulled it over the greater tuberosity and anchored it, and a second PushLock after fashioning a pilot hole with an awl with appropriate tensioning in a double-row fashion.  This closed and advanced the tendon and provided full coverage.  We removed the redundant suture, irrigated the joint, remainder of the tendon was unremarkable.  We used a 3 mm Kerrison.  Previously, we removed a small spur off the anterolateral aspect of the acromion.  Next, we closed the raphe with 1 Vicryl, subcu with 2-0, and skin with Prolene.  Sterile dressing applied.  Placed in a sling, extubated without difficulty, and transported to the recovery room in satisfactory condition.  The patient tolerated the procedure well.  No complications.  Minimal blood loss.  Assistant, Skip MayerBlair Roberts, was used throughout the case for patient positioning, gentle intermittent traction of the upper extremity, and arthroscopic irrigation and monitoring.     Jene EveryJeffrey Kayal Mula, M.D.   ______________________________ Jene EveryJeffrey Babak Lucus, M.D.    Cordelia PenJB/MEDQ  D:  06/18/2016  T:  06/18/2016  Job:  [id number]

## 2016-06-18 NOTE — Anesthesia Postprocedure Evaluation (Signed)
Anesthesia Post Note  Patient: George Graham  Procedure(s) Performed: Procedure(s) (LRB): Left shouler arthroscopy, subacromial decompression, mini open rotator cuff repair (Left)     Patient location during evaluation: PACU Anesthesia Type: General and Regional Level of consciousness: awake and alert Pain management: pain level controlled Vital Signs Assessment: post-procedure vital signs reviewed and stable Respiratory status: spontaneous breathing, nonlabored ventilation, respiratory function stable and patient connected to nasal cannula oxygen Cardiovascular status: blood pressure returned to baseline and stable Postop Assessment: no signs of nausea or vomiting Anesthetic complications: no    Last Vitals:  Vitals:   06/18/16 0955 06/18/16 1035  BP: 126/72 123/72  Pulse: 80 83  Resp: 14 18  Temp: 36.6 C 36.5 C    Last Pain:  Vitals:   06/18/16 1035  TempSrc: Oral  PainSc:                  Shone Leventhal S

## 2016-06-18 NOTE — Brief Op Note (Signed)
06/18/2016  8:55 AM  PATIENT:  George Graham  61 y.o. male  PRE-OPERATIVE DIAGNOSIS:  Rotator cuff tear, labral tear left shoulder  POST-OPERATIVE DIAGNOSIS:  Rotator cuff tear, labral tear left shoulder  PROCEDURE:  Procedure(s) with comments: Left shouler arthroscopy, subacromial decompression, mini open rotator cuff repair (Left) - 2 hrs  SURGEON:  Surgeon(s) and Role:     Jene Every* Cyani Kallstrom, MD - Primary  PHYSICIAN ASSISTANT:   ASSISTANTSSu Hilt: Roberts   ANESTHESIA:   general  EBL:  No intake/output data recorded.  BLOOD ADMINISTERED:none  DRAINS: none   LOCAL MEDICATIONS USED:  MARCAINE     SPECIMEN:  No Specimen  DISPOSITION OF SPECIMEN:  N/A  COUNTS:  YES  TOURNIQUET:  * No tourniquets in log *  DICTATION: .Other Dictation: Dictation Number [id number] number not heard.  PLAN OF CARE: Discharge to home after PACU  PATIENT DISPOSITION:  PACU - hemodynamically stable.   Delay start of Pharmacological VTE agent (>24hrs) due to surgical blood loss or risk of bleeding: not applicable

## 2016-06-18 NOTE — Anesthesia Procedure Notes (Signed)
Procedure Name: Intubation Date/Time: 06/18/2016 7:34 AM Performed by: Leroy LibmanEARDON, Lua Feng L Patient Re-evaluated:Patient Re-evaluated prior to inductionOxygen Delivery Method: Circle system utilized Preoxygenation: Pre-oxygenation with 100% oxygen Intubation Type: IV induction Ventilation: Mask ventilation without difficulty and Oral airway inserted - appropriate to patient size Laryngoscope Size: Miller and 3 Grade View: Grade I Tube type: Oral Tube size: 8.0 mm Number of attempts: 1 Airway Equipment and Method: Stylet Placement Confirmation: ETT inserted through vocal cords under direct vision,  positive ETCO2 and breath sounds checked- equal and bilateral Secured at: 23 cm Tube secured with: Tape Dental Injury: Teeth and Oropharynx as per pre-operative assessment

## 2016-06-19 ENCOUNTER — Encounter (HOSPITAL_COMMUNITY): Payer: Self-pay | Admitting: Specialist

## 2016-06-23 DIAGNOSIS — E7800 Pure hypercholesterolemia, unspecified: Secondary | ICD-10-CM | POA: Diagnosis not present

## 2016-06-23 DIAGNOSIS — I10 Essential (primary) hypertension: Secondary | ICD-10-CM | POA: Diagnosis not present

## 2016-06-23 DIAGNOSIS — R350 Frequency of micturition: Secondary | ICD-10-CM | POA: Diagnosis not present

## 2016-06-23 DIAGNOSIS — N401 Enlarged prostate with lower urinary tract symptoms: Secondary | ICD-10-CM | POA: Diagnosis not present

## 2016-06-23 DIAGNOSIS — E119 Type 2 diabetes mellitus without complications: Secondary | ICD-10-CM | POA: Diagnosis not present

## 2016-06-23 DIAGNOSIS — Z0000 Encounter for general adult medical examination without abnormal findings: Secondary | ICD-10-CM | POA: Diagnosis not present

## 2016-06-23 DIAGNOSIS — Z125 Encounter for screening for malignant neoplasm of prostate: Secondary | ICD-10-CM | POA: Diagnosis not present

## 2016-07-17 DIAGNOSIS — M25612 Stiffness of left shoulder, not elsewhere classified: Secondary | ICD-10-CM | POA: Diagnosis not present

## 2016-07-21 DIAGNOSIS — M25612 Stiffness of left shoulder, not elsewhere classified: Secondary | ICD-10-CM | POA: Diagnosis not present

## 2016-07-23 DIAGNOSIS — M25612 Stiffness of left shoulder, not elsewhere classified: Secondary | ICD-10-CM | POA: Diagnosis not present

## 2016-07-28 DIAGNOSIS — M25612 Stiffness of left shoulder, not elsewhere classified: Secondary | ICD-10-CM | POA: Diagnosis not present

## 2016-07-30 DIAGNOSIS — M25612 Stiffness of left shoulder, not elsewhere classified: Secondary | ICD-10-CM | POA: Diagnosis not present

## 2016-08-04 DIAGNOSIS — M25612 Stiffness of left shoulder, not elsewhere classified: Secondary | ICD-10-CM | POA: Diagnosis not present

## 2016-08-06 DIAGNOSIS — M25612 Stiffness of left shoulder, not elsewhere classified: Secondary | ICD-10-CM | POA: Diagnosis not present

## 2016-08-11 DIAGNOSIS — M25612 Stiffness of left shoulder, not elsewhere classified: Secondary | ICD-10-CM | POA: Diagnosis not present

## 2016-08-13 DIAGNOSIS — M25612 Stiffness of left shoulder, not elsewhere classified: Secondary | ICD-10-CM | POA: Diagnosis not present

## 2016-08-18 DIAGNOSIS — M25612 Stiffness of left shoulder, not elsewhere classified: Secondary | ICD-10-CM | POA: Diagnosis not present

## 2016-09-01 DIAGNOSIS — M25612 Stiffness of left shoulder, not elsewhere classified: Secondary | ICD-10-CM | POA: Diagnosis not present

## 2016-09-03 DIAGNOSIS — M25612 Stiffness of left shoulder, not elsewhere classified: Secondary | ICD-10-CM | POA: Diagnosis not present

## 2016-09-08 DIAGNOSIS — M25612 Stiffness of left shoulder, not elsewhere classified: Secondary | ICD-10-CM | POA: Diagnosis not present

## 2016-09-10 DIAGNOSIS — M25612 Stiffness of left shoulder, not elsewhere classified: Secondary | ICD-10-CM | POA: Diagnosis not present

## 2016-09-15 DIAGNOSIS — M25612 Stiffness of left shoulder, not elsewhere classified: Secondary | ICD-10-CM | POA: Diagnosis not present

## 2016-09-18 DIAGNOSIS — M25612 Stiffness of left shoulder, not elsewhere classified: Secondary | ICD-10-CM | POA: Diagnosis not present

## 2016-09-22 DIAGNOSIS — M25612 Stiffness of left shoulder, not elsewhere classified: Secondary | ICD-10-CM | POA: Diagnosis not present

## 2016-09-24 DIAGNOSIS — M25612 Stiffness of left shoulder, not elsewhere classified: Secondary | ICD-10-CM | POA: Diagnosis not present

## 2016-10-01 DIAGNOSIS — M25612 Stiffness of left shoulder, not elsewhere classified: Secondary | ICD-10-CM | POA: Diagnosis not present

## 2016-10-02 DIAGNOSIS — M25612 Stiffness of left shoulder, not elsewhere classified: Secondary | ICD-10-CM | POA: Diagnosis not present

## 2016-10-06 DIAGNOSIS — M25612 Stiffness of left shoulder, not elsewhere classified: Secondary | ICD-10-CM | POA: Diagnosis not present

## 2016-10-08 DIAGNOSIS — M25612 Stiffness of left shoulder, not elsewhere classified: Secondary | ICD-10-CM | POA: Diagnosis not present

## 2016-12-25 DIAGNOSIS — F325 Major depressive disorder, single episode, in full remission: Secondary | ICD-10-CM | POA: Diagnosis not present

## 2016-12-25 DIAGNOSIS — I10 Essential (primary) hypertension: Secondary | ICD-10-CM | POA: Diagnosis not present

## 2016-12-25 DIAGNOSIS — E119 Type 2 diabetes mellitus without complications: Secondary | ICD-10-CM | POA: Diagnosis not present

## 2016-12-25 DIAGNOSIS — E7800 Pure hypercholesterolemia, unspecified: Secondary | ICD-10-CM | POA: Diagnosis not present

## 2017-07-20 DIAGNOSIS — Z125 Encounter for screening for malignant neoplasm of prostate: Secondary | ICD-10-CM | POA: Diagnosis not present

## 2017-07-20 DIAGNOSIS — Z0000 Encounter for general adult medical examination without abnormal findings: Secondary | ICD-10-CM | POA: Diagnosis not present

## 2017-07-20 DIAGNOSIS — I10 Essential (primary) hypertension: Secondary | ICD-10-CM | POA: Diagnosis not present

## 2017-07-20 DIAGNOSIS — Z23 Encounter for immunization: Secondary | ICD-10-CM | POA: Diagnosis not present

## 2017-07-20 DIAGNOSIS — E1169 Type 2 diabetes mellitus with other specified complication: Secondary | ICD-10-CM | POA: Diagnosis not present

## 2017-07-20 DIAGNOSIS — E7800 Pure hypercholesterolemia, unspecified: Secondary | ICD-10-CM | POA: Diagnosis not present

## 2017-07-20 DIAGNOSIS — R6882 Decreased libido: Secondary | ICD-10-CM | POA: Diagnosis not present

## 2017-07-20 DIAGNOSIS — F325 Major depressive disorder, single episode, in full remission: Secondary | ICD-10-CM | POA: Diagnosis not present

## 2017-10-28 DIAGNOSIS — Z23 Encounter for immunization: Secondary | ICD-10-CM | POA: Diagnosis not present

## 2017-11-10 IMAGING — US US ABDOMEN LIMITED
1 series · 14 of 25 positions shown · non-contrast
Comparison: None.

CLINICAL DATA: One day history of right upper quadrant pain

EXAM:
US ABDOMEN LIMITED - RIGHT UPPER QUADRANT

[Series 1: us abdomen limited · 0.23mm/px · 14 of 67 slices shown]
[im 1/67]
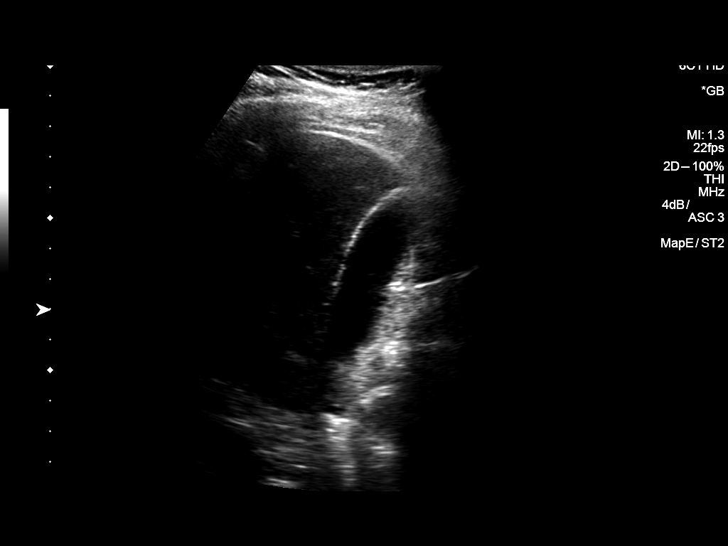
[im 6/67]
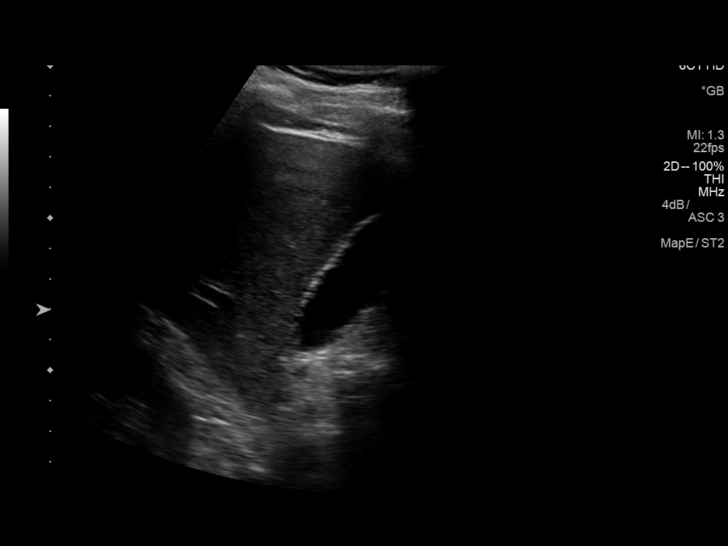
[im 12/67]
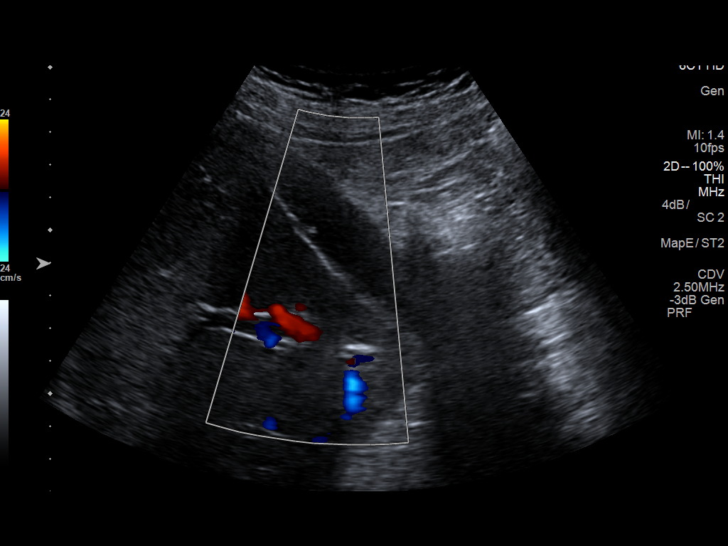
[im 17/67]
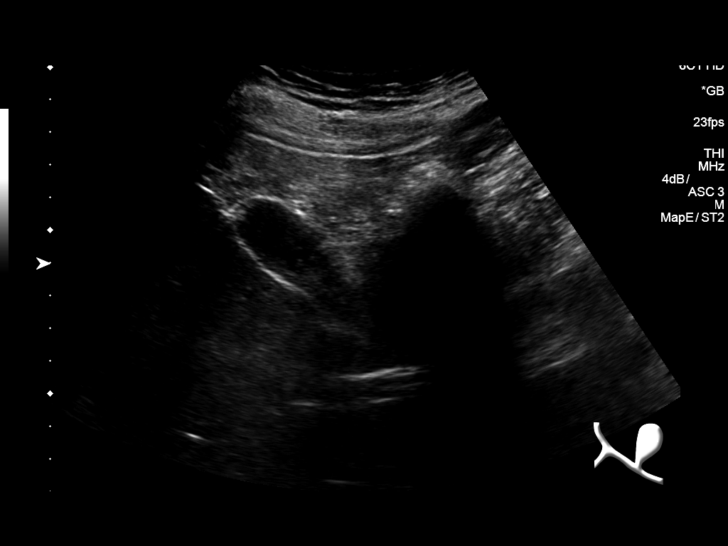
[im 23/67]
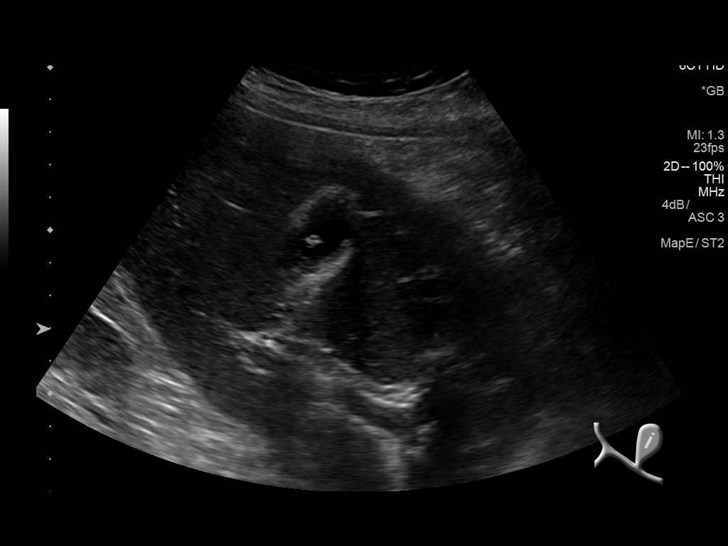
[im 25/67]
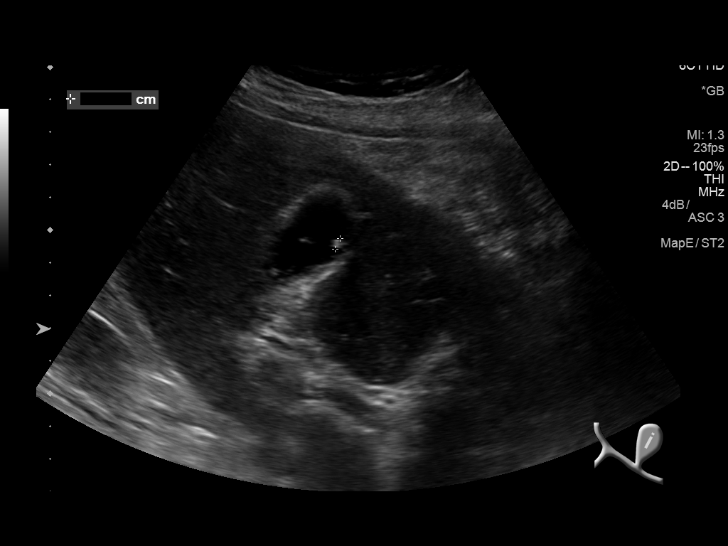
[im 31/67]
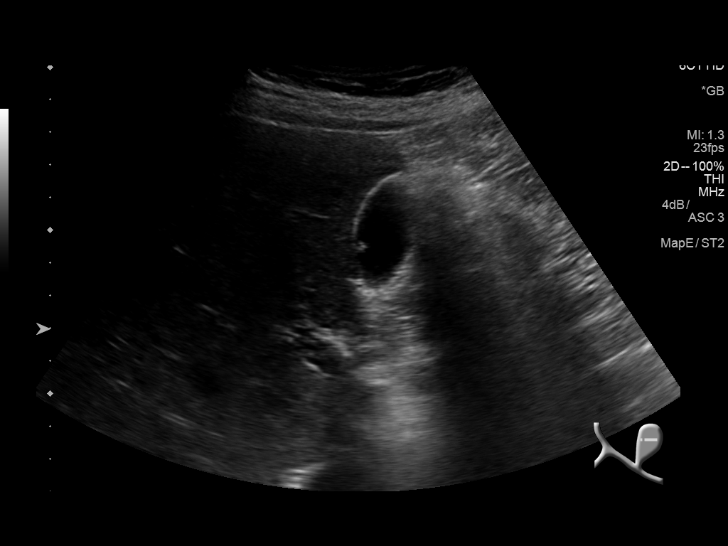
[im 36/67]
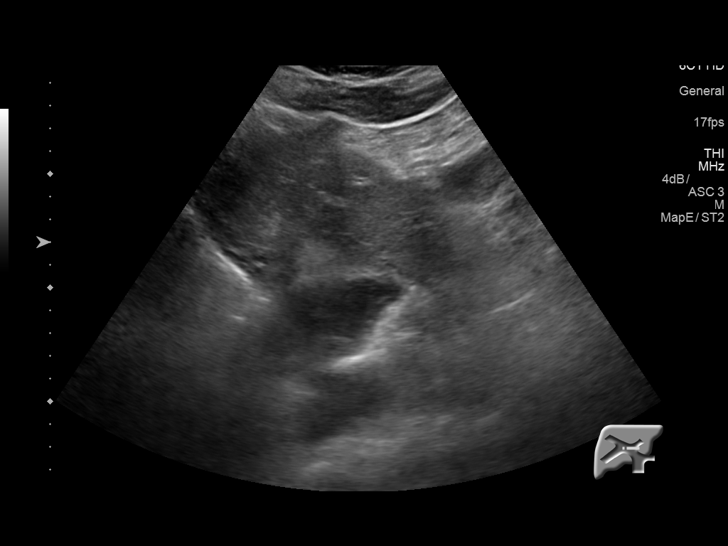
[im 42/67]
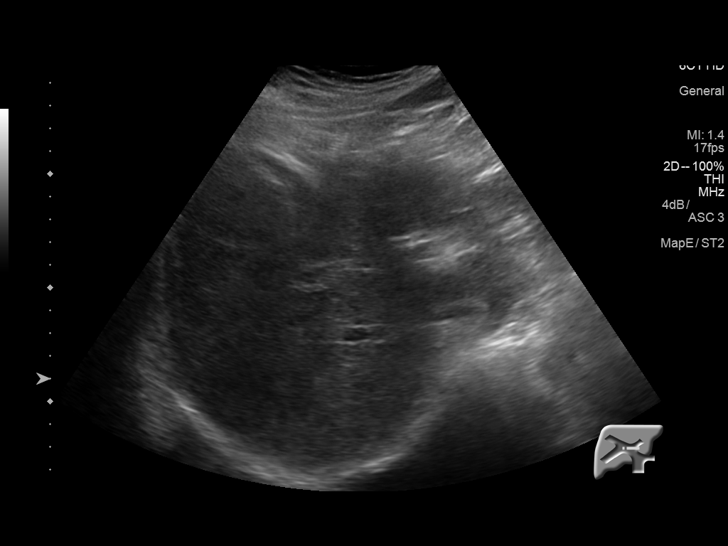
[im 45/67]
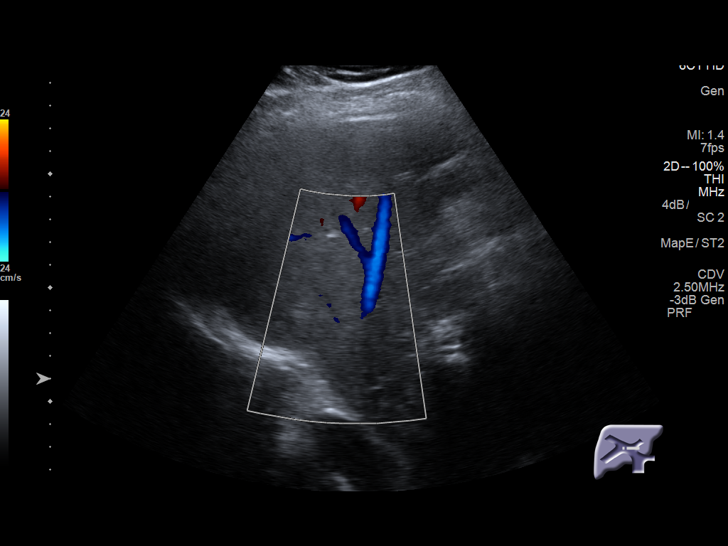
[im 50/67]
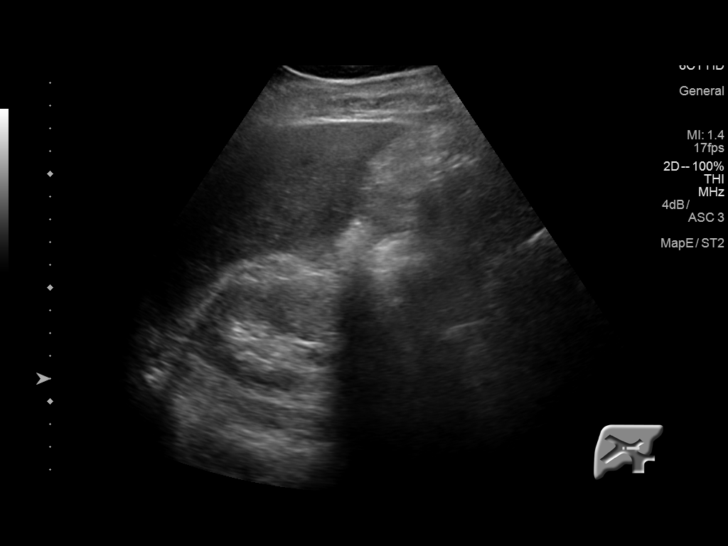
[im 56/67]
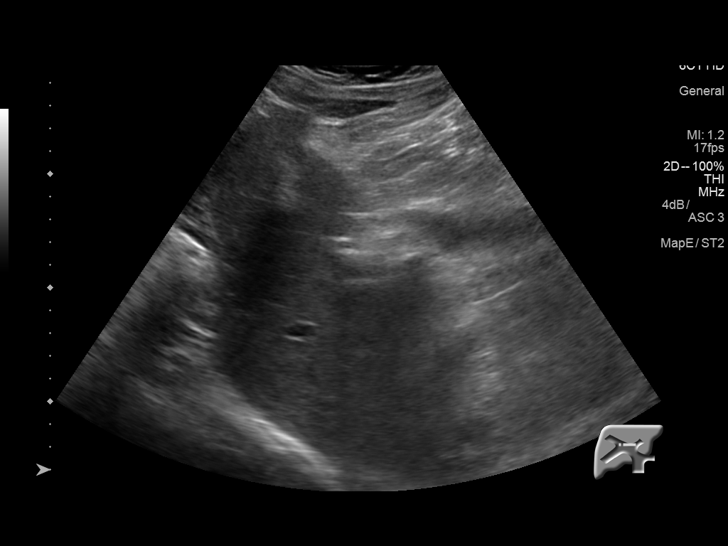
[im 61/67]
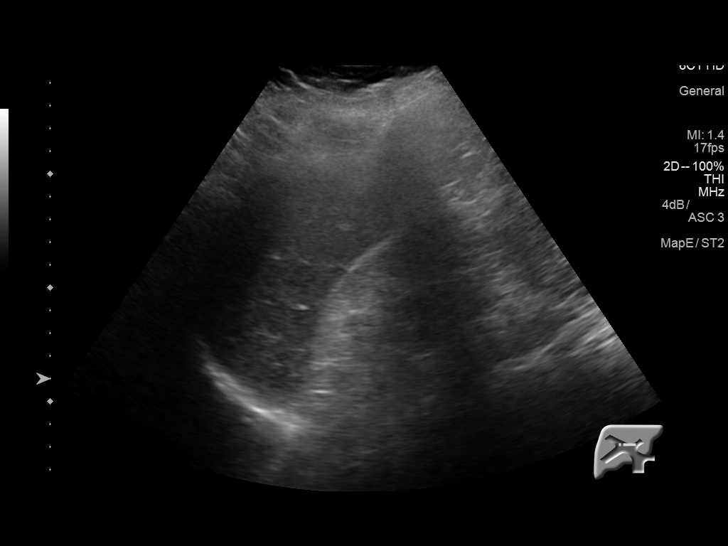
[im 67/67]
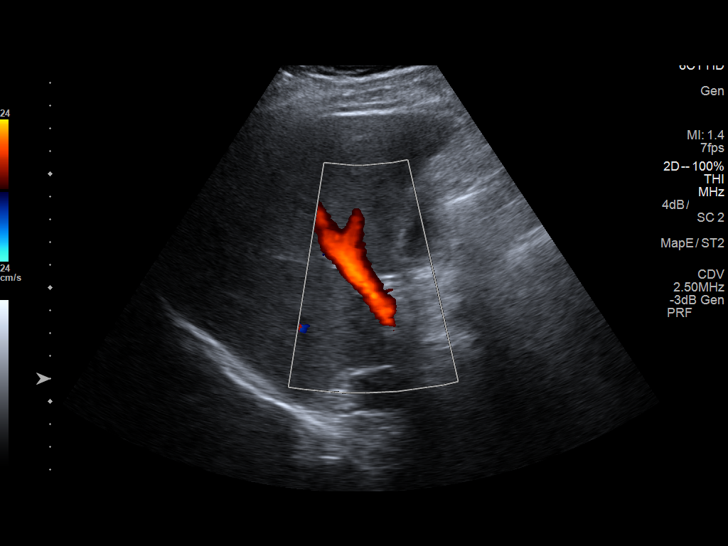

[14 of 25 positions shown; findings below may reference images not displayed]

FINDINGS: Gallbladder:

There are several 2-3 mm echogenic foci in the gallbladder which
neither move nor shadow, likely small polyps. There is mild sludge
in the gallbladder. There are no echogenic foci which move and
shadow as is expected with gallstones. The gallbladder wall appears
subtly edematous with slight thickening. No pericholecystic fluid.
Patient is tender over the gallbladder.

Common bile duct:

Diameter: 3 mm. No intrahepatic or extrahepatic biliary duct
dilatation.

Liver:

No focal lesion identified. Within normal limits in parenchymal
echogenicity.
IMPRESSION: Gallbladder wall appearance subtly edematous with focal tenderness
over the gallbladder. No gallstones are seen. There is mild sludge
and probable 2-3 mm scattered polyps in the gallbladder. This
overall appearance raises concern for a degree of acalculus
cholecystitis. This finding may warrant correlation with nuclear
medicine hepatobiliary imaging study to assess for cystic duct
patency. Study otherwise unremarkable.

## 2018-01-13 DIAGNOSIS — M5136 Other intervertebral disc degeneration, lumbar region: Secondary | ICD-10-CM | POA: Diagnosis not present

## 2018-01-13 DIAGNOSIS — H1033 Unspecified acute conjunctivitis, bilateral: Secondary | ICD-10-CM | POA: Diagnosis not present

## 2018-01-13 DIAGNOSIS — M545 Low back pain: Secondary | ICD-10-CM | POA: Diagnosis not present

## 2018-01-24 DIAGNOSIS — M545 Low back pain: Secondary | ICD-10-CM | POA: Diagnosis not present

## 2018-01-28 DIAGNOSIS — M5136 Other intervertebral disc degeneration, lumbar region: Secondary | ICD-10-CM | POA: Diagnosis not present

## 2018-01-28 DIAGNOSIS — M545 Low back pain: Secondary | ICD-10-CM | POA: Diagnosis not present

## 2018-01-31 DIAGNOSIS — E7800 Pure hypercholesterolemia, unspecified: Secondary | ICD-10-CM | POA: Diagnosis not present

## 2018-01-31 DIAGNOSIS — F325 Major depressive disorder, single episode, in full remission: Secondary | ICD-10-CM | POA: Diagnosis not present

## 2018-01-31 DIAGNOSIS — E1169 Type 2 diabetes mellitus with other specified complication: Secondary | ICD-10-CM | POA: Diagnosis not present

## 2018-01-31 DIAGNOSIS — I10 Essential (primary) hypertension: Secondary | ICD-10-CM | POA: Diagnosis not present

## 2018-02-08 DIAGNOSIS — M545 Low back pain: Secondary | ICD-10-CM | POA: Diagnosis not present

## 2018-04-23 DIAGNOSIS — S91209A Unspecified open wound of unspecified toe(s) with damage to nail, initial encounter: Secondary | ICD-10-CM | POA: Diagnosis not present

## 2018-09-20 DIAGNOSIS — F325 Major depressive disorder, single episode, in full remission: Secondary | ICD-10-CM | POA: Diagnosis not present

## 2018-09-20 DIAGNOSIS — Z0000 Encounter for general adult medical examination without abnormal findings: Secondary | ICD-10-CM | POA: Diagnosis not present

## 2018-09-20 DIAGNOSIS — E1169 Type 2 diabetes mellitus with other specified complication: Secondary | ICD-10-CM | POA: Diagnosis not present

## 2018-09-20 DIAGNOSIS — I10 Essential (primary) hypertension: Secondary | ICD-10-CM | POA: Diagnosis not present

## 2018-09-20 DIAGNOSIS — E7800 Pure hypercholesterolemia, unspecified: Secondary | ICD-10-CM | POA: Diagnosis not present

## 2018-09-27 DIAGNOSIS — Z125 Encounter for screening for malignant neoplasm of prostate: Secondary | ICD-10-CM | POA: Diagnosis not present

## 2018-09-27 DIAGNOSIS — E7800 Pure hypercholesterolemia, unspecified: Secondary | ICD-10-CM | POA: Diagnosis not present

## 2018-09-27 DIAGNOSIS — E1169 Type 2 diabetes mellitus with other specified complication: Secondary | ICD-10-CM | POA: Diagnosis not present

## 2018-12-01 ENCOUNTER — Encounter: Payer: Self-pay | Admitting: Podiatry

## 2018-12-01 ENCOUNTER — Ambulatory Visit (INDEPENDENT_AMBULATORY_CARE_PROVIDER_SITE_OTHER): Payer: BC Managed Care – PPO

## 2018-12-01 ENCOUNTER — Other Ambulatory Visit: Payer: Self-pay

## 2018-12-01 ENCOUNTER — Ambulatory Visit: Payer: BC Managed Care – PPO | Admitting: Podiatry

## 2018-12-01 VITALS — BP 125/80 | HR 66 | Resp 16

## 2018-12-01 DIAGNOSIS — S90852A Superficial foreign body, left foot, initial encounter: Secondary | ICD-10-CM

## 2018-12-01 DIAGNOSIS — M7730 Calcaneal spur, unspecified foot: Secondary | ICD-10-CM | POA: Diagnosis not present

## 2018-12-01 DIAGNOSIS — M722 Plantar fascial fibromatosis: Secondary | ICD-10-CM

## 2018-12-01 NOTE — Patient Instructions (Signed)

## 2018-12-05 NOTE — Progress Notes (Signed)
Subjective:   Patient ID: Gibson Ramp, male   DOB: 63 y.o.   MRN: [id number]   HPI 63 year old male presents the office today for concerns of a painful spot on bottom of his left heel which is been ongoing last 1 month.  He states he previously had a "knot" and some swelling to the area but this resolved there is still one specific area that is causing pain.  He denies any drainage or pus.  Denies any recent injury.  At first he thought it was a wart but there may be a foreign body.  He does not remember stepping on any objects.  His tetanus is up-to-date.  He also states he has a heel pain which is been ongoing prior to this he points more to the plantar medial aspect of the calcaneus.  He denies any recent injury to the foot when this started denies any swelling.   Review of Systems  All other systems reviewed and are negative.  Past Medical History:  Diagnosis Date  . Angina 12/12  . Depression   . Hard of hearing 12/15/2010   hear bette ron right side   . Hypercholesteremia 12/15/2010  . Hyperlipidemia   . Hypertension    EKG, CHEST 12/12 EPIC  . Recurrent upper respiratory infection (URI)    "cold vs allergies" 3 weeks ago- states resolved- no fever  . Tobacco abuse 12/15/2010    Past Surgical History:  Procedure Laterality Date  . CARDIAC CATHETERIZATION  12/12   EPIC  . CARDIAC CATHETERIZATION  2012  . COLONOSCOPY    . LEFT HEART CATHETERIZATION WITH CORONARY ANGIOGRAM N/A 12/16/2010   Procedure: LEFT HEART CATHETERIZATION WITH CORONARY ANGIOGRAM;  Surgeon: Chrystie Nose, MD;  Location: [facility] CATH LAB;  Service: Cardiovascular;  Laterality: N/A;  . SHOULDER ARTHROSCOPY WITH ROTATOR CUFF REPAIR AND SUBACROMIAL DECOMPRESSION Left 06/18/2016   Procedure: Left shouler arthroscopy, subacromial decompression, mini open rotator cuff repair;  Surgeon: Jene Every, MD;  Location: [facility] ORS;  Service: Orthopedics;  Laterality: Left;  2 hrs     Current Outpatient Medications:  .   FLUoxetine (PROZAC) 40 MG capsule, Take 40 mg by mouth daily. AM, Disp: , Rfl:  .  influenza vaccine (FLUCELVAX QUADRIVALENT) 0.5 ML injection, Flucelvax Quad 2019-2020 (PF) 60 mcg (15 mcg x 4)/0.5 mL IM syringe, Disp: , Rfl:  .  lisinopril-hydrochlorothiazide (PRINZIDE,ZESTORETIC) 20-25 MG per tablet, Take 1 tablet by mouth daily. AM, Disp: , Rfl:  .  Naphazoline HCl (CLEAR EYES OP), Place 1 drop into both eyes daily., Disp: , Rfl:  .  pravastatin (PRAVACHOL) 20 MG tablet, Take 20 mg by mouth every other day., Disp: , Rfl:   No Known Allergies      Objective:  Physical Exam  General: AAO x3, NAD  Dermatological: On the plantar aspect the left heel is a hyperkeratotic lesion.  Upon debridement there was a small piece of clear glass that was removed.  Upon further debridement there is no further foreign body identified.  There is no drainage or pus or any signs of an abscess or infection.  Vascular: Dorsalis Pedis artery and Posterior Tibial artery pedal pulses are 2/4 bilateral with immedate capillary fill time.  There is no pain with calf compression, swelling, warmth, erythema.   Neruologic: Grossly intact via light touch bilateral. Vibratory intact via tuning fork bilateral. Protective threshold with Semmes Wienstein monofilament intact to all pedal sites bilateral.   Musculoskeletal: Tenderness of the hyperkeratotic lesion prior to debridement.  There is tenderness on the plantar medial tubercle of the calcaneus at the insertion of  Although mild.  Achilles tendon appears to be intact.  Muscular strength 5/5 in all groups tested bilateral.  Gait: Unassisted, Nonantalgic.       Assessment:   63 year old male for right left heel foreign body; plantar fasciitis      Plan:  -Treatment options discussed including all alternatives, risks, and complications -Etiology of symptoms were discussed -X-rays were obtained and reviewed with the patient. X-rays obtained after removal of the  piece of glass.  There is no further evidence of any foreign object identified. -Debrided hyperkeratotic tissue today after cleaning the area with alcohol.  A small piece of glass was removed.  No further foreign body identified.  Recommend antibiotic ointment dressing changes daily. Monitor for any clinical signs or symptoms of infection and directed to call the office immediately should any occur or go to the ER. -In regards to the plantar fasciitis we discussed stretching exercises, icing.  Discussed shoe modifications and orthotics.  This was on 3 July came to the office but he did mention he had some occasional discomfort.  If no improvement discussed steroid injection.  Return if symptoms worsen or fail to improve.  Trula Slade DPM

## 2019-01-07 DIAGNOSIS — U071 COVID-19: Secondary | ICD-10-CM | POA: Diagnosis not present

## 2019-01-12 DIAGNOSIS — U071 COVID-19: Secondary | ICD-10-CM | POA: Diagnosis not present

## 2019-02-07 DIAGNOSIS — S60551A Superficial foreign body of right hand, initial encounter: Secondary | ICD-10-CM | POA: Diagnosis not present

## 2019-05-05 DIAGNOSIS — F325 Major depressive disorder, single episode, in full remission: Secondary | ICD-10-CM | POA: Diagnosis not present

## 2019-05-05 DIAGNOSIS — I10 Essential (primary) hypertension: Secondary | ICD-10-CM | POA: Diagnosis not present

## 2019-05-05 DIAGNOSIS — E1169 Type 2 diabetes mellitus with other specified complication: Secondary | ICD-10-CM | POA: Diagnosis not present

## 2019-05-05 DIAGNOSIS — E7800 Pure hypercholesterolemia, unspecified: Secondary | ICD-10-CM | POA: Diagnosis not present

## 2019-07-28 DIAGNOSIS — E119 Type 2 diabetes mellitus without complications: Secondary | ICD-10-CM | POA: Diagnosis not present

## 2019-09-04 DIAGNOSIS — E7800 Pure hypercholesterolemia, unspecified: Secondary | ICD-10-CM | POA: Diagnosis not present

## 2019-09-04 DIAGNOSIS — F325 Major depressive disorder, single episode, in full remission: Secondary | ICD-10-CM | POA: Diagnosis not present

## 2019-09-04 DIAGNOSIS — G4700 Insomnia, unspecified: Secondary | ICD-10-CM | POA: Diagnosis not present

## 2019-09-04 DIAGNOSIS — I10 Essential (primary) hypertension: Secondary | ICD-10-CM | POA: Diagnosis not present

## 2019-10-29 DIAGNOSIS — Z20822 Contact with and (suspected) exposure to covid-19: Secondary | ICD-10-CM | POA: Diagnosis not present

## 2019-10-29 DIAGNOSIS — Z03818 Encounter for observation for suspected exposure to other biological agents ruled out: Secondary | ICD-10-CM | POA: Diagnosis not present

## 2019-11-02 DIAGNOSIS — Z20822 Contact with and (suspected) exposure to covid-19: Secondary | ICD-10-CM | POA: Diagnosis not present

## 2019-11-02 DIAGNOSIS — Z03818 Encounter for observation for suspected exposure to other biological agents ruled out: Secondary | ICD-10-CM | POA: Diagnosis not present

## 2019-11-16 DIAGNOSIS — Z0000 Encounter for general adult medical examination without abnormal findings: Secondary | ICD-10-CM | POA: Diagnosis not present

## 2019-11-16 DIAGNOSIS — E1169 Type 2 diabetes mellitus with other specified complication: Secondary | ICD-10-CM | POA: Diagnosis not present

## 2019-11-16 DIAGNOSIS — Z125 Encounter for screening for malignant neoplasm of prostate: Secondary | ICD-10-CM | POA: Diagnosis not present

## 2019-11-16 DIAGNOSIS — I10 Essential (primary) hypertension: Secondary | ICD-10-CM | POA: Diagnosis not present

## 2019-11-16 DIAGNOSIS — R0981 Nasal congestion: Secondary | ICD-10-CM | POA: Diagnosis not present

## 2019-11-16 DIAGNOSIS — E7800 Pure hypercholesterolemia, unspecified: Secondary | ICD-10-CM | POA: Diagnosis not present

## 2020-01-26 DIAGNOSIS — Z03818 Encounter for observation for suspected exposure to other biological agents ruled out: Secondary | ICD-10-CM | POA: Diagnosis not present

## 2020-01-26 DIAGNOSIS — J069 Acute upper respiratory infection, unspecified: Secondary | ICD-10-CM | POA: Diagnosis not present

## 2020-05-17 DIAGNOSIS — E1169 Type 2 diabetes mellitus with other specified complication: Secondary | ICD-10-CM | POA: Diagnosis not present

## 2020-05-17 DIAGNOSIS — E7800 Pure hypercholesterolemia, unspecified: Secondary | ICD-10-CM | POA: Diagnosis not present

## 2020-05-17 DIAGNOSIS — I10 Essential (primary) hypertension: Secondary | ICD-10-CM | POA: Diagnosis not present

## 2020-05-17 DIAGNOSIS — F325 Major depressive disorder, single episode, in full remission: Secondary | ICD-10-CM | POA: Diagnosis not present

## 2020-07-23 DIAGNOSIS — S00411A Abrasion of right ear, initial encounter: Secondary | ICD-10-CM | POA: Diagnosis not present

## 2020-09-27 DIAGNOSIS — Z23 Encounter for immunization: Secondary | ICD-10-CM | POA: Diagnosis not present

## 2020-10-08 DIAGNOSIS — Z23 Encounter for immunization: Secondary | ICD-10-CM | POA: Diagnosis not present

## 2020-11-13 DIAGNOSIS — K5730 Diverticulosis of large intestine without perforation or abscess without bleeding: Secondary | ICD-10-CM | POA: Diagnosis not present

## 2020-11-13 DIAGNOSIS — D123 Benign neoplasm of transverse colon: Secondary | ICD-10-CM | POA: Diagnosis not present

## 2020-11-13 DIAGNOSIS — Z09 Encounter for follow-up examination after completed treatment for conditions other than malignant neoplasm: Secondary | ICD-10-CM | POA: Diagnosis not present

## 2020-11-13 DIAGNOSIS — Z86010 Personal history of colonic polyps: Secondary | ICD-10-CM | POA: Diagnosis not present

## 2020-11-13 DIAGNOSIS — Z1211 Encounter for screening for malignant neoplasm of colon: Secondary | ICD-10-CM | POA: Diagnosis not present

## 2020-11-19 DIAGNOSIS — I10 Essential (primary) hypertension: Secondary | ICD-10-CM | POA: Diagnosis not present

## 2020-11-19 DIAGNOSIS — E1169 Type 2 diabetes mellitus with other specified complication: Secondary | ICD-10-CM | POA: Diagnosis not present

## 2020-11-19 DIAGNOSIS — E7800 Pure hypercholesterolemia, unspecified: Secondary | ICD-10-CM | POA: Diagnosis not present

## 2020-11-19 DIAGNOSIS — F325 Major depressive disorder, single episode, in full remission: Secondary | ICD-10-CM | POA: Diagnosis not present

## 2020-12-27 DIAGNOSIS — U071 COVID-19: Secondary | ICD-10-CM | POA: Diagnosis not present

## 2020-12-27 DIAGNOSIS — J069 Acute upper respiratory infection, unspecified: Secondary | ICD-10-CM | POA: Diagnosis not present

## 2021-05-26 DIAGNOSIS — Z0000 Encounter for general adult medical examination without abnormal findings: Secondary | ICD-10-CM | POA: Diagnosis not present

## 2021-05-26 DIAGNOSIS — E7800 Pure hypercholesterolemia, unspecified: Secondary | ICD-10-CM | POA: Diagnosis not present

## 2021-05-26 DIAGNOSIS — Z23 Encounter for immunization: Secondary | ICD-10-CM | POA: Diagnosis not present

## 2021-05-26 DIAGNOSIS — F325 Major depressive disorder, single episode, in full remission: Secondary | ICD-10-CM | POA: Diagnosis not present

## 2021-05-26 DIAGNOSIS — Z125 Encounter for screening for malignant neoplasm of prostate: Secondary | ICD-10-CM | POA: Diagnosis not present

## 2021-05-26 DIAGNOSIS — E1169 Type 2 diabetes mellitus with other specified complication: Secondary | ICD-10-CM | POA: Diagnosis not present

## 2021-05-26 DIAGNOSIS — I10 Essential (primary) hypertension: Secondary | ICD-10-CM | POA: Diagnosis not present

## 2021-05-26 DIAGNOSIS — F439 Reaction to severe stress, unspecified: Secondary | ICD-10-CM | POA: Diagnosis not present

## 2021-07-13 DIAGNOSIS — R0781 Pleurodynia: Secondary | ICD-10-CM | POA: Diagnosis not present

## 2021-07-16 DIAGNOSIS — R0789 Other chest pain: Secondary | ICD-10-CM | POA: Diagnosis not present

## 2021-07-16 DIAGNOSIS — R0781 Pleurodynia: Secondary | ICD-10-CM | POA: Diagnosis not present

## 2021-09-13 DIAGNOSIS — S90932A Unspecified superficial injury of left great toe, initial encounter: Secondary | ICD-10-CM | POA: Diagnosis not present

## 2021-09-22 DIAGNOSIS — F410 Panic disorder [episodic paroxysmal anxiety] without agoraphobia: Secondary | ICD-10-CM | POA: Diagnosis not present

## 2021-09-26 DIAGNOSIS — N529 Male erectile dysfunction, unspecified: Secondary | ICD-10-CM | POA: Diagnosis not present

## 2021-10-06 DIAGNOSIS — F410 Panic disorder [episodic paroxysmal anxiety] without agoraphobia: Secondary | ICD-10-CM | POA: Diagnosis not present

## 2021-10-20 DIAGNOSIS — F410 Panic disorder [episodic paroxysmal anxiety] without agoraphobia: Secondary | ICD-10-CM | POA: Diagnosis not present

## 2021-11-03 DIAGNOSIS — F410 Panic disorder [episodic paroxysmal anxiety] without agoraphobia: Secondary | ICD-10-CM | POA: Diagnosis not present

## 2021-11-06 DIAGNOSIS — N529 Male erectile dysfunction, unspecified: Secondary | ICD-10-CM | POA: Diagnosis not present

## 2021-11-24 DIAGNOSIS — F410 Panic disorder [episodic paroxysmal anxiety] without agoraphobia: Secondary | ICD-10-CM | POA: Diagnosis not present

## 2021-12-15 DIAGNOSIS — F410 Panic disorder [episodic paroxysmal anxiety] without agoraphobia: Secondary | ICD-10-CM | POA: Diagnosis not present

## 2021-12-31 DIAGNOSIS — M25511 Pain in right shoulder: Secondary | ICD-10-CM | POA: Diagnosis not present

## 2022-01-08 DIAGNOSIS — M7541 Impingement syndrome of right shoulder: Secondary | ICD-10-CM | POA: Diagnosis not present

## 2022-01-08 DIAGNOSIS — F410 Panic disorder [episodic paroxysmal anxiety] without agoraphobia: Secondary | ICD-10-CM | POA: Diagnosis not present

## 2022-01-23 DIAGNOSIS — F325 Major depressive disorder, single episode, in full remission: Secondary | ICD-10-CM | POA: Diagnosis not present

## 2022-01-23 DIAGNOSIS — E7800 Pure hypercholesterolemia, unspecified: Secondary | ICD-10-CM | POA: Diagnosis not present

## 2022-01-23 DIAGNOSIS — I10 Essential (primary) hypertension: Secondary | ICD-10-CM | POA: Diagnosis not present

## 2022-01-23 DIAGNOSIS — E1169 Type 2 diabetes mellitus with other specified complication: Secondary | ICD-10-CM | POA: Diagnosis not present

## 2022-02-02 DIAGNOSIS — F410 Panic disorder [episodic paroxysmal anxiety] without agoraphobia: Secondary | ICD-10-CM | POA: Diagnosis not present

## 2022-02-12 DIAGNOSIS — L570 Actinic keratosis: Secondary | ICD-10-CM | POA: Diagnosis not present

## 2022-02-12 DIAGNOSIS — X32XXXA Exposure to sunlight, initial encounter: Secondary | ICD-10-CM | POA: Diagnosis not present

## 2022-02-12 DIAGNOSIS — D225 Melanocytic nevi of trunk: Secondary | ICD-10-CM | POA: Diagnosis not present

## 2022-02-12 DIAGNOSIS — Z1283 Encounter for screening for malignant neoplasm of skin: Secondary | ICD-10-CM | POA: Diagnosis not present

## 2022-02-23 DIAGNOSIS — F410 Panic disorder [episodic paroxysmal anxiety] without agoraphobia: Secondary | ICD-10-CM | POA: Diagnosis not present

## 2022-02-28 ENCOUNTER — Emergency Department (HOSPITAL_BASED_OUTPATIENT_CLINIC_OR_DEPARTMENT_OTHER)
Admission: EM | Admit: 2022-02-28 | Discharge: 2022-02-28 | Disposition: A | Discharge status: Home / Self Care | Payer: BC Managed Care – PPO | Attending: Emergency Medicine | Admitting: Emergency Medicine

## 2022-02-28 ENCOUNTER — Encounter (HOSPITAL_BASED_OUTPATIENT_CLINIC_OR_DEPARTMENT_OTHER): Payer: Self-pay

## 2022-02-28 ENCOUNTER — Emergency Department (HOSPITAL_BASED_OUTPATIENT_CLINIC_OR_DEPARTMENT_OTHER): Payer: BC Managed Care – PPO | Admitting: Radiology

## 2022-02-28 ENCOUNTER — Other Ambulatory Visit (HOSPITAL_BASED_OUTPATIENT_CLINIC_OR_DEPARTMENT_OTHER): Payer: Self-pay

## 2022-02-28 ENCOUNTER — Emergency Department (HOSPITAL_BASED_OUTPATIENT_CLINIC_OR_DEPARTMENT_OTHER): Payer: BC Managed Care – PPO

## 2022-02-28 DIAGNOSIS — W19XXXA Unspecified fall, initial encounter: Secondary | ICD-10-CM

## 2022-02-28 DIAGNOSIS — Z23 Encounter for immunization: Secondary | ICD-10-CM | POA: Diagnosis not present

## 2022-02-28 DIAGNOSIS — I10 Essential (primary) hypertension: Secondary | ICD-10-CM | POA: Diagnosis not present

## 2022-02-28 DIAGNOSIS — Y9302 Activity, running: Secondary | ICD-10-CM | POA: Diagnosis not present

## 2022-02-28 DIAGNOSIS — M25511 Pain in right shoulder: Secondary | ICD-10-CM | POA: Insufficient documentation

## 2022-02-28 DIAGNOSIS — S0081XA Abrasion of other part of head, initial encounter: Secondary | ICD-10-CM | POA: Insufficient documentation

## 2022-02-28 DIAGNOSIS — S0993XA Unspecified injury of face, initial encounter: Secondary | ICD-10-CM | POA: Diagnosis not present

## 2022-02-28 DIAGNOSIS — Z955 Presence of coronary angioplasty implant and graft: Secondary | ICD-10-CM | POA: Insufficient documentation

## 2022-02-28 DIAGNOSIS — W01198A Fall on same level from slipping, tripping and stumbling with subsequent striking against other object, initial encounter: Secondary | ICD-10-CM | POA: Insufficient documentation

## 2022-02-28 DIAGNOSIS — Z79899 Other long term (current) drug therapy: Secondary | ICD-10-CM | POA: Insufficient documentation

## 2022-02-28 DIAGNOSIS — S80212A Abrasion, left knee, initial encounter: Secondary | ICD-10-CM | POA: Insufficient documentation

## 2022-02-28 DIAGNOSIS — S0031XA Abrasion of nose, initial encounter: Secondary | ICD-10-CM

## 2022-02-28 DIAGNOSIS — S0990XA Unspecified injury of head, initial encounter: Secondary | ICD-10-CM

## 2022-02-28 DIAGNOSIS — M47812 Spondylosis without myelopathy or radiculopathy, cervical region: Secondary | ICD-10-CM | POA: Diagnosis not present

## 2022-02-28 MED ORDER — HYDROCODONE-ACETAMINOPHEN 5-325 MG PO TABS
1.0000 | ORAL_TABLET | Freq: Once | ORAL | Status: AC
  Administered 2022-02-28: 1 via ORAL
  Filled 2022-02-28: qty 1

## 2022-02-28 MED ORDER — HYDROCODONE-ACETAMINOPHEN 5-325 MG PO TABS: 1.0000 | ORAL_TABLET | Freq: Four times a day (QID) | ORAL | 0 refills | Status: DC | PRN

## 2022-02-28 MED ORDER — TETANUS-DIPHTH-ACELL PERTUSSIS 5-2.5-18.5 LF-MCG/0.5 IM SUSY
0.5000 mL | PREFILLED_SYRINGE | Freq: Once | INTRAMUSCULAR | Status: AC
  Administered 2022-02-28: 0.5 mL via INTRAMUSCULAR
  Filled 2022-02-28: qty 0.5

## 2022-02-28 MED ORDER — HYDROCODONE-ACETAMINOPHEN 5-325 MG PO TABS
1.0000 | ORAL_TABLET | Freq: Four times a day (QID) | ORAL | 0 refills | Status: DC | PRN
  Filled 2022-02-28: qty 14, 4d supply, fill #0

## 2022-02-28 NOTE — ED Provider Notes (Signed)
[facility] Provider Note   CSN: [id number] Arrival date & time: 02/28/22  1027     History  Chief Complaint  Patient presents with   George Graham is a 67 y.o. male.  Patient status post a fall he tripped while running this morning.  Landed on the pavement.  Was dazed but no loss of consciousness.  Has an abrasion to his nose had nosebleeding which is subsequently stopped.  Swelling to the nose.  Complain of right shoulder pain.  Patient had rotator cuff repair to that area before was bothering some prior to the fall but is hurting more now.  Slight abrasion to the left knee but has no concerns about his is knee pain.  Denies any back pain thoracic or lumbar.  No neck pain but did have some numbness to his left little finger while running.  Patient is not on blood thinners.  Past medical history significant for depression hyperlipidemia high cholesterol hard of hearing hypertension angina.  Patient had left heart catheterization in 2012 no stents.  And had a shoulder rotator cuff repair listed as left.  But patient implies that it was right in 2018.  Patient is a former smoker quit in 2017.       Home Medications Prior to Admission medications   Medication Sig Start Date End Date Taking? Authorizing Provider  FLUoxetine (PROZAC) 40 MG capsule Take 40 mg by mouth daily. AM    [provider]  influenza vaccine (FLUCELVAX QUADRIVALENT) 0.5 ML injection Flucelvax Quad 2019-2020 (PF) 60 mcg (15 mcg x 4)/0.5 mL IM syringe    [provider]  lisinopril-hydrochlorothiazide (PRINZIDE,ZESTORETIC) 20-25 MG per tablet Take 1 tablet by mouth daily. AM    [provider]  Naphazoline HCl (CLEAR EYES OP) Place 1 drop into both eyes daily.    [provider]  pravastatin (PRAVACHOL) 20 MG tablet Take 20 mg by mouth every other day.    [provider]      Allergies    Patient has no known  allergies.    Review of Systems   Review of Systems  Constitutional:  Negative for chills and fever.  HENT:  Positive for nosebleeds. Negative for ear pain and sore throat.   Eyes:  Negative for pain and visual disturbance.  Respiratory:  Negative for cough and shortness of breath.   Cardiovascular:  Negative for chest pain and palpitations.  Gastrointestinal:  Negative for abdominal pain and vomiting.  Genitourinary:  Negative for dysuria and hematuria.  Musculoskeletal:  Negative for arthralgias, back pain and joint swelling.  Skin:  Positive for wound. Negative for color change and rash.  Neurological:  Negative for seizures and syncope.  All other systems reviewed and are negative.   Physical Exam Updated Vital Signs BP 131/80 (BP Location: Right Arm)   Pulse 64   Temp (!) 97.4 F (36.3 C) (Oral)   Resp 18   SpO2 97%  Physical Exam Vitals and nursing note reviewed.  Constitutional:      General: He is not in acute distress.    Appearance: Normal appearance. He is well-developed.  HENT:     Head: Normocephalic.     Comments: Superficial abrasion to left side of chin.  Large abrasion to the bridge of the nose with swelling and erythema.  No active bleeding.  No septal hematoma inside.  No gross deformity.    Mouth/Throat:     Mouth:  Mucous membranes are moist.     Pharynx: Oropharynx is clear.  Eyes:     Extraocular Movements: Extraocular movements intact.     Conjunctiva/sclera: Conjunctivae normal.     Pupils: Pupils are equal, round, and reactive to light.  Neck:     Comments: Some questionable cervical tenderness on the left side. Cardiovascular:     Rate and Rhythm: Normal rate and regular rhythm.     Heart sounds: No murmur heard. Pulmonary:     Effort: Pulmonary effort is normal. No respiratory distress.     Breath sounds: Normal breath sounds.  Abdominal:     Palpations: Abdomen is soft.     Tenderness: There is no abdominal tenderness.  Musculoskeletal:         General: Tenderness present. No swelling or deformity.     Cervical back: Normal range of motion and neck supple. Tenderness present.     Comments: Some discomfort with range of motion of the right shoulder.  Radial pulse 2+ distally neurovascularly intact.  Also neurovascularly intact on the left upper extremity.  Superficial abrasion to left knee no active bleeding.  Skin:    General: Skin is warm and dry.     Capillary Refill: Capillary refill takes less than 2 seconds.  Neurological:     General: No focal deficit present.     Mental Status: He is alert and oriented to person, place, and time.     Cranial Nerves: No cranial nerve deficit.     Sensory: No sensory deficit.     Motor: No weakness.  Psychiatric:        Mood and Affect: Mood normal.     ED Results / Procedures / Treatments   Labs (all labs ordered are listed, but only abnormal results are displayed) Labs Reviewed - No data to display  EKG None  Radiology No results found.  Procedures Procedures    Medications Ordered in ED Medications  Tdap (BOOSTRIX) injection 0.5 mL (has no administration in time range)    ED Course/ Medical Decision Making/ A&P                             Medical Decision Making Amount and/or Complexity of Data Reviewed Radiology: ordered.  Risk Prescription drug management.   Fall with significant facial abrasions and nose injury.  No active nosebleed currently.  Patient not up-to-date on tetanus.  Patient with superficial abrasion to the left knee.  Patient with complaint of increased right shoulder pain.  States he has had rotator cuff surgery repair on that shoulder.  Will get CT head neck and face and x-ray of the right shoulder.  Tetanus will be updated.   Final Clinical Impression(s) / ED Diagnoses Final diagnoses:  Fall, initial encounter  Injury of head, initial encounter  Abrasion of nose, initial encounter  Acute pain of right shoulder    Rx / DC  Orders ED Discharge Orders     None         George Sorrow, MD 02/28/22 1303

## 2022-02-28 NOTE — ED Triage Notes (Signed)
He states that, while running this morning he waved to a passerby and thereby "lost my concentration and I tripped and fell". His entire ant. Nose is abraded with minimal bilat. Epistaxis noted. He denies losing censoriousness and has no other c/o pain or discomfort.

## 2022-02-28 NOTE — Discharge Instructions (Addendum)
Follow-up with your orthopedic surgeon.  Wear the shoulder immobilizer on the right arm as needed for comfort.  Take the pain medicine hydrocodone as needed for pain.  Make an appointment to follow-up with facial trauma for the nose injury.  CT head face and neck without any acute findings.  X-rays of the right shoulder without any acute bony abnormalities but there still could be a rotator cuff injury.  Apply antibiotic ointment to the abrasion on the nose 3 times a day wash gently with soap and water.  If nose starts to rebleed instructions provided.

## 2022-03-03 DIAGNOSIS — M25511 Pain in right shoulder: Secondary | ICD-10-CM | POA: Diagnosis not present

## 2022-03-09 ENCOUNTER — Telehealth: Payer: Self-pay | Admitting: Radiology

## 2022-03-09 DIAGNOSIS — F410 Panic disorder [episodic paroxysmal anxiety] without agoraphobia: Secondary | ICD-10-CM | POA: Diagnosis not present

## 2022-03-09 NOTE — Telephone Encounter (Signed)
The pt phone number is [phone].  Thanks

## 2022-03-09 NOTE — Telephone Encounter (Signed)
Patient called Friday and LMVM asking for someone to call him.  Recent ED visit.  Can you please call him back?

## 2022-03-10 NOTE — Telephone Encounter (Signed)
I called and s/w patient.  No further needed.

## 2022-03-12 ENCOUNTER — Encounter: Payer: Self-pay | Admitting: Radiology

## 2022-03-16 DIAGNOSIS — D225 Melanocytic nevi of trunk: Secondary | ICD-10-CM | POA: Diagnosis not present

## 2022-03-27 DIAGNOSIS — M25511 Pain in right shoulder: Secondary | ICD-10-CM | POA: Diagnosis not present

## 2022-03-30 DIAGNOSIS — F410 Panic disorder [episodic paroxysmal anxiety] without agoraphobia: Secondary | ICD-10-CM | POA: Diagnosis not present

## 2022-04-05 DIAGNOSIS — M25511 Pain in right shoulder: Secondary | ICD-10-CM | POA: Diagnosis not present

## 2022-04-14 DIAGNOSIS — M25511 Pain in right shoulder: Secondary | ICD-10-CM | POA: Diagnosis not present

## 2022-04-16 ENCOUNTER — Ambulatory Visit: Payer: Self-pay | Admitting: Orthopedic Surgery

## 2022-04-20 DIAGNOSIS — F410 Panic disorder [episodic paroxysmal anxiety] without agoraphobia: Secondary | ICD-10-CM | POA: Diagnosis not present

## 2022-04-21 NOTE — Patient Instructions (Signed)
SURGICAL WAITING ROOM VISITATION Patients having surgery or a procedure may have no more than 2 support people in the waiting area - these visitors may rotate in the visitor waiting room.   Due to an increase in RSV and influenza rates and associated hospitalizations, children ages 60 and under may not visit patients in [facility] hospitals. If the patient needs to stay at the hospital during part of their recovery, the visitor guidelines for inpatient rooms apply.  PRE-OP VISITATION  Pre-op nurse will coordinate an appropriate time for 1 support person to accompany the patient in pre-op.  This support person may not rotate.  This visitor will be contacted when the time is appropriate for the visitor to come back in the pre-op area.  Please refer to the [facility] website for the visitor guidelines for Inpatients (after your surgery is over and you are in a regular room).  You are not required to quarantine at this time prior to your surgery. However, you must do this: Hand Hygiene often Do NOT share personal items Notify your provider if you are in close contact with someone who has COVID or you develop fever 100.4 or greater, new onset of sneezing, cough, sore throat, shortness of breath or body aches.  If you test positive for Covid or have been in contact with anyone that has tested positive in the last 10 days please notify you surgeon.    Your procedure is scheduled on:  Friday  May 01, 2022  Report to [facility] Main Entrance: [address] entrance where the [employer] is available.   Report to admitting at: 10:00    AM  +++++Call this number if you have any questions or problems the morning of surgery [phone]  Do not eat food after Midnight the night prior to your surgery/procedure.  After Midnight you may have the following liquids until  09:30  AM DAY OF SURGERY  Clear Liquid Diet Water Black Coffee (sugar ok, NO MILK/CREAM OR CREAMERS)  Tea (sugar ok, NO  MILK/CREAM OR CREAMERS) regular and decaf                             Plain Jell-O  with no fruit (NO RED)                                           Fruit ices (not with fruit pulp, NO RED)                                     Popsicles (NO RED)                                                                  Juice: apple, WHITE grape, WHITE cranberry Sports drinks like Gatorade or Powerade (NO RED)                   The day of surgery:  Drink ONE (1) Pre-Surgery G2  at 09:30  AM the morning of surgery. Drink in one sitting.  Do not sip.  This drink was given to you during your hospital pre-op appointment visit. Nothing else to drink after completing the Pre-Surgery G2: No candy, chewing gum or throat lozenges.    FOLLOW  ANY ADDITIONAL PRE OP INSTRUCTIONS YOU RECEIVED FROM YOUR SURGEON'S OFFICE!!!   Oral Hygiene is also important to reduce your risk of infection.        Remember - BRUSH YOUR TEETH THE MORNING OF SURGERY WITH YOUR REGULAR TOOTHPASTE  Do NOT smoke after Midnight the night before surgery.  Take ONLY these medicines the morning of surgery with A SIP OF WATER: Fluoxetine (Prozac), You may use your Eye drops if needed. You may take the Hydrocodone if needed for pain                    You may not have any metal on your body including jewelry, and body piercing  Do not wear lotions, powders, cologne, or deodorant  Men may shave face and neck.  Patients discharged on the day of surgery will not be allowed to drive home.  Someone NEEDS to stay with you for the first 24 hours after anesthesia.  Do not bring your home medications to the hospital. The Pharmacy will dispense medications listed on your medication list to you during your admission in the Hospital.  Special Instructions: Bring a copy of your healthcare power of attorney and living will documents the day of surgery, if you wish to have them scanned into your [facility]- EPIC  Please read over the  following fact sheets you were given: IF YOU HAVE QUESTIONS ABOUT YOUR PRE-OP INSTRUCTIONS, PLEASE CALL [phone].   [place] - Preparing for Surgery Before surgery, you can play an important role.  Because skin is not sterile, your skin needs to be as free of germs as possible.  You can reduce the number of germs on your skin by washing with CHG (chlorahexidine gluconate) soap before surgery.  CHG is an antiseptic cleaner which kills germs and bonds with the skin to continue killing germs even after washing. Please DO NOT use if you have an allergy to CHG or antibacterial soaps.  If your skin becomes reddened/irritated stop using the CHG and inform your nurse when you arrive at Short Stay. Do not shave (including legs and underarms) for at least 48 hours prior to the first CHG shower.  You may shave your face/neck.  Please follow these instructions carefully:  1.  Shower with CHG Soap the night before surgery and the  morning of surgery.  2.  If you choose to wash your hair, wash your hair first as usual with your normal  shampoo.  3.  After you shampoo, rinse your hair and body thoroughly to remove the shampoo.                             4.  Use CHG as you would any other liquid soap.  You can apply chg directly to the skin and wash.  Gently with a scrungie or clean washcloth.  5.  Apply the CHG Soap to your body ONLY FROM THE NECK DOWN.   Do not use on face/ open                           Wound or open sores. Avoid contact with eyes, ears mouth and genitals (private parts).  Wash face,  Genitals (private parts) with your normal soap.             6.  Wash thoroughly, paying special attention to the area where your  surgery  will be performed.  7.  Thoroughly rinse your body with warm water from the neck down.  8.  DO NOT shower/wash with your normal soap after using and rinsing off the CHG Soap.            9.  Pat yourself dry with a clean towel.            10.  Wear  clean pajamas.            11.  Place clean sheets on your bed the night of your first shower and do not  sleep with pets.  ON THE DAY OF SURGERY : Do not apply any lotions/deodorants the morning of surgery.  Please wear clean clothes to the hospital/surgery center.    FAILURE TO FOLLOW THESE INSTRUCTIONS MAY RESULT IN THE CANCELLATION OF YOUR SURGERY  PATIENT SIGNATURE_________________________________  NURSE SIGNATURE__________________________________  ________________________________________________________________________     Rogelia Mire    An incentive spirometer is a tool that can help keep your lungs clear and active. This tool measures how well you are filling your lungs with each breath. Taking long deep breaths may help reverse or decrease the chance of developing breathing (pulmonary) problems (especially infection) following: A long period of time when you are unable to move or be active. BEFORE THE PROCEDURE  If the spirometer includes an indicator to show your best effort, your nurse or respiratory therapist will set it to a desired goal. If possible, sit up straight or lean slightly forward. Try not to slouch. Hold the incentive spirometer in an upright position. INSTRUCTIONS FOR USE  Sit on the edge of your bed if possible, or sit up as far as you can in bed or on a chair. Hold the incentive spirometer in an upright position. Breathe out normally. Place the mouthpiece in your mouth and seal your lips tightly around it. Breathe in slowly and as deeply as possible, raising the piston or the ball toward the top of the column. Hold your breath for 3-5 seconds or for as long as possible. Allow the piston or ball to fall to the bottom of the column. Remove the mouthpiece from your mouth and breathe out normally. Rest for a few seconds and repeat Steps 1 through 7 at least 10 times every 1-2 hours when you are awake. Take your time and take a few normal breaths  between deep breaths. The spirometer may include an indicator to show your best effort. Use the indicator as a goal to work toward during each repetition. After each set of 10 deep breaths, practice coughing to be sure your lungs are clear. If you have an incision (the cut made at the time of surgery), support your incision when coughing by placing a pillow or rolled up towels firmly against it. Once you are able to get out of bed, walk around indoors and cough well. You may stop using the incentive spirometer when instructed by your caregiver.  RISKS AND COMPLICATIONS Take your time so you do not get dizzy or light-headed. If you are in pain, you may need to take or ask for pain medication before doing incentive spirometry. It is harder to take a deep breath if you are having pain. AFTER USE Rest and breathe slowly and easily. It  can be helpful to keep track of a log of your progress. Your caregiver can provide you with a simple table to help with this. If you are using the spirometer at home, follow these instructions: SEEK MEDICAL CARE IF:  You are having difficultly using the spirometer. You have trouble using the spirometer as often as instructed. Your pain medication is not giving enough relief while using the spirometer. You develop fever of 100.5 F (38.1 C) or higher.                                                                                                    SEEK IMMEDIATE MEDICAL CARE IF:  You cough up bloody sputum that had not been present before. You develop fever of 102 F (38.9 C) or greater. You develop worsening pain at or near the incision site. MAKE SURE YOU:  Understand these instructions. Will watch your condition. Will get help right away if you are not doing well or get worse. Document Released: 05/11/2006 Document Revised: 03/23/2011 Document Reviewed: 07/12/2006 [facility] Patient Information 2014 [employer] [place], [place].

## 2022-04-21 NOTE — Progress Notes (Signed)
COVID Vaccine received:  []  No [x]  Yes Date of any COVID positive Test in last 90 days:  none  PCP - Tally Joe at [facility]  [facility] - none  Chest x-ray - 2015  2v  Epic EKG -  06-02-2016  Epic    will repeat at PST Stress Test -  ECHO -  Cardiac Cath - 12-16-2010  [facility] by Dr. Rennis Golden    PCR screen: []  Ordered & Completed           []   No Order but Needs PROFEND           [x]   N/A for this surgery  Surgery Plan:  [x]  Ambulatory                            []  Outpatient in bed                            []  Admit  Anesthesia:    []  General  []  Spinal                           [x]   Choice []   MAC  Pacemaker / ICD device [x]  No []  Yes   Spinal Cord Stimulator:[x]  No []  Yes       History of Sleep Apnea? [x]  No []  Yes   CPAP used?- [x]  No []  Yes    Does the patient monitor blood sugar?          [x]  No []  Yes  []  N/A Last A1c on 01-23-22?  6.2 Patient has: []  NO Hx DM   [x]  Pre-DM                 []  DM1  []   DM2 Does patient have a [employer] or Dexacom? [x]  No []  Yes   Fasting Blood Sugar Ranges-  Checks Blood Sugar __0_ times a day  Blood Thinner / Instructions: none Aspirin Instructions:  none  ERAS Protocol Ordered: []  No  [x]  Yes PRE-SURGERY [x]  ENSURE  []  G2  Patient is to be NPO after: 09:30  Activity level: Patient is able to climb a flight of stairs without difficulty; [x]  No CP  [x]  No SOB.  Patient can perform ADLs without assistance.   Anesthesia review: HTN, Pre-DM, very HOH- no HAs now Right ear is the best, hx angina [facility]- no stents (by Dr. Rennis Golden in 2012)  Patient denies shortness of breath, fever, cough and chest pain at PAT appointment.  Patient verbalized understanding and agreement to the Pre-Surgical Instructions that were given to them at this PAT appointment. Patient was also educated of the need to review these PAT instructions again prior to his surgery.I reviewed the appropriate phone numbers to call if they have any and questions or  concerns.

## 2022-04-22 ENCOUNTER — Encounter (HOSPITAL_COMMUNITY)
Admission: RE | Admit: 2022-04-22 | Discharge: 2022-04-22 | Disposition: A | Discharge status: Home / Self Care | Payer: BC Managed Care – PPO | Source: Ambulatory Visit | Attending: Specialist | Admitting: Specialist

## 2022-04-22 ENCOUNTER — Encounter (HOSPITAL_COMMUNITY): Payer: Self-pay

## 2022-04-22 ENCOUNTER — Other Ambulatory Visit: Payer: Self-pay

## 2022-04-22 VITALS — BP 134/74 | HR 66 | Temp 98.7°F | Resp 16 | Ht 69.0 in | Wt 187.0 lb

## 2022-04-22 DIAGNOSIS — R7303 Prediabetes: Secondary | ICD-10-CM | POA: Diagnosis not present

## 2022-04-22 DIAGNOSIS — Z01818 Encounter for other preprocedural examination: Secondary | ICD-10-CM | POA: Insufficient documentation

## 2022-04-22 DIAGNOSIS — I10 Essential (primary) hypertension: Secondary | ICD-10-CM | POA: Diagnosis not present

## 2022-04-22 HISTORY — DX: Anxiety disorder, unspecified: F41.9

## 2022-04-22 HISTORY — DX: Attention-deficit hyperactivity disorder, unspecified type: F90.9

## 2022-04-22 HISTORY — DX: Pneumonia, unspecified organism: J18.9

## 2022-04-22 HISTORY — DX: Prediabetes: R73.03

## 2022-04-22 LAB — BASIC METABOLIC PANEL
Anion gap: 11 (ref 5–15)
BUN: 20 mg/dL (ref 8–23)
CO2: 28 mmol/L (ref 22–32)
Calcium: 9.6 mg/dL (ref 8.9–10.3)
Chloride: 101 mmol/L (ref 98–111)
Creatinine, Ser: 0.89 mg/dL (ref 0.61–1.24)
GFR, Estimated: >60 mL/min (ref >60)
Glucose, Bld: 109 mg/dL — ABNORMAL HIGH (ref 70–99)
Potassium: 4.5 mmol/L (ref 3.5–5.1)
Sodium: 140 mmol/L (ref 135–145)

## 2022-04-22 LAB — CBC
HCT: 40.9 % (ref 39.0–52.0)
Hemoglobin: 13.1 g/dL (ref 13.0–17.0)
MCH: 31.1 pg (ref 26.0–34.0)
MCHC: 32 g/dL (ref 30.0–36.0)
MCV: 97.1 fL (ref 80.0–100.0)
Platelets: 283 10*3/uL (ref 150–400)
RBC: 4.21 MIL/uL — ABNORMAL LOW (ref 4.22–5.81)
RDW: 13.2 % (ref 11.5–15.5)
WBC: 6.3 10*3/uL (ref 4.0–10.5)
nRBC: 0 % (ref 0.0–0.2)

## 2022-04-22 LAB — HEMOGLOBIN A1C
Hgb A1c MFr Bld: 6.2 % — ABNORMAL HIGH (ref 4.8–5.6)
Mean Plasma Glucose: 131.24 mg/dL

## 2022-04-22 LAB — GLUCOSE, CAPILLARY: Glucose-Capillary: 121 mg/dL — ABNORMAL HIGH (ref 70–99)

## 2022-04-29 ENCOUNTER — Ambulatory Visit: Payer: Self-pay | Admitting: Orthopedic Surgery

## 2022-04-29 NOTE — H&P (Signed)
George Graham is an 67 y.o. male.   Chief Complaint: R shoulder pain HPI: Visit For: Follow up; right shoulder MRI results Hand Dominance: right Location: right; shoulder Duration: 6 weeks; 6 weeks - 02/28/22 Severity: severe; severe pain with ROM - no pain while resting Timing: acute Context: fall (while running); 02/28/22 Alleviating Factors: rest; NSAIDs (meloxicam) Aggravating Factors: ROM Associated Symptoms: no numbness; no tingling; no swelling; no redness; no warmth; no ecchymosis; no drainage; no fever; no chills; weakness Previous Surgery: 06/17/11 - right subacromial decompression & RCR by Dr. Beane 06/18/16 - left shoulder arthroscopy: subacromial decompression & RCR by Dr. Beane Prior Imaging: x ray; MRI Previous Injections: helped temporarily  Past Medical History:  Diagnosis Date   ADHD (attention deficit hyperactivity disorder)    Angina 12/2010   Anxiety    Depression    Hard of hearing 12/15/2010   hears better on right side needs HAs,   Hypercholesteremia 12/15/2010   Hyperlipidemia    Hypertension    EKG, CHEST 12/12 [facility]   Pneumonia    Pre-diabetes    Recurrent upper respiratory infection (URI)    "cold vs allergies" 3 weeks ago- states resolved- no fever   Tobacco abuse 12/15/2010    Past Surgical History:  Procedure Laterality Date   COLONOSCOPY     LEFT HEART CATHETERIZATION WITH CORONARY ANGIOGRAM N/A 12/16/2010   Procedure: LEFT HEART CATHETERIZATION WITH CORONARY ANGIOGRAM;  Surgeon: Kenneth C. Hilty, MD;  Location: [facility] CATH [facility];  Service: Cardiovascular;  Laterality: N/A;   SHOULDER ARTHROSCOPY WITH ROTATOR CUFF REPAIR AND SUBACROMIAL DECOMPRESSION Left 06/18/2016   Procedure: Left shouler arthroscopy, subacromial decompression, mini open rotator cuff repair;  Surgeon: Beane, Jeffrey, MD;  Location: [facility];  Service: Orthopedics;  Laterality: Left;  2 hrs   WISDOM TOOTH EXTRACTION      Family History  Problem Relation Age of Onset    Coronary artery disease Maternal Grandfather    Coronary artery disease Paternal Grandfather    Atrial fibrillation Mother    Cancer Father    Depression Brother    Prostate cancer Neg Hx    Colon cancer Neg Hx    Lung cancer Neg Hx    Social History:  reports that he quit smoking about 7 years ago. His smoking use included cigarettes. He has a 11.50 pack-year smoking history. He has never used smokeless tobacco. He reports that he does not drink alcohol and does not use drugs.  Allergies:  Allergies  Allergen Reactions   Statins Diarrhea and Nausea Only    Leg cramps/GI upset  Lipitor and Crestor   Current meds: FLUoxetine 40 mg capsule HYDROcodone 5 mg-acetaminophen 325 mg tablet lisinopriL 20 mg-hydrochlorothiazide 25 mg tablet meloxicam 15 mg tablet polymyxin B sulfate 10,000 unit-trimethoprim 1 mg/mL eye drops pravastatin 20 mg tablet tadalafiL 5 mg tablet Vitamins and Minerals  Review of Systems  Constitutional: Negative.   HENT: Negative.    Eyes: Negative.   Respiratory: Negative.    Cardiovascular: Negative.   Gastrointestinal: Negative.   Endocrine: Negative.   Genitourinary: Negative.   Musculoskeletal:  Positive for arthralgias and myalgias.  Skin: Negative.   Neurological: Negative.   Psychiatric/Behavioral: Negative.      There were no vitals taken for this visit. Physical Exam Constitutional:      Appearance: Normal appearance.  HENT:     Head: Normocephalic and atraumatic.     Right Ear: External ear normal.     Left Ear: External ear normal.       Nose: Nose normal.     Mouth/Throat:     Pharynx: Oropharynx is clear.  Eyes:     Conjunctiva/sclera: Conjunctivae normal.  Cardiovascular:     Rate and Rhythm: Normal rate.     Pulses: Normal pulses.     Heart sounds: Normal heart sounds.  Pulmonary:     Effort: Pulmonary effort is normal.     Breath sounds: Normal breath sounds.  Abdominal:     General: Bowel sounds are normal.   Musculoskeletal:     Cervical back: Normal range of motion.     Comments: He is nontender over the [facility] has a positive impingement sign positive second impingement sign is weak and external rotation slightly weak in internal rotation. Active abduction is 90 degrees forward flexion is to 130. There is pain associated with abduction  Skin:    General: Skin is warm and dry.  Neurological:     Mental Status: He is alert.    MRI of the shoulder demonstrates tears of the supraspinatus and infraspinatus. This is read as chronic with mild atrophy. There is high-grade tearing of the insertion of the subscap. With severe muscle atrophy. Moderate AC arthrosis mild glenohumeral arthrosis. Probable remote biceps rupture.   Assessment/Plan Impression:  1. 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 3. History of a biceps tendon rupture  Plan:  We discussed options living with his symptoms with activity modification which she has tried and has been unsuccessful. The other is to consider a revision rotator cuff repair with patch graft. He only has mild muscle atrophy. Only mild osteoarthritis of the shoulder. I discussed the salvage procedure which would be a reverse shoulder rate or a total shoulder replacement. He understands that. We also discussed the patch graft.  An extensive discussion concerning the pathology relevant anatomy and treatment options. After that discussion we mutually agreed to proceed with repair of the rotator cuff utilizing arthroscopic assistance if possible. The risks and benefits of that procedure were discussed including bleeding, infection, suboptimal range of motion, deep venous thrombosis, pulmonary embolism, anesthetic complications etc. in addition we discussed the postoperative course to include approximately 4 weeks of passive range of motion followed by 4 weeks of active  range of motion followed by 4-12 weeks of progressive strengthening exercises. In addition we discussed protective activities to reduce the risk of a reinjury including impingement activities with elbow above the shoulder as well as reaching and repetitive circular motion activities. The hospital stay will either be as a outpatient with a regional block versus overnight depending upon the extent of the procedure and any challenging health issues with a first postoperative visit 2 weeks following the surgery.   No history of DVT or MRSA. Able to take Kefzol. Will use oxycodone postoperatively outpatient. Preoperative clearance aspirin postoperatively for DVT prophylax  Plan Revision R shoulder scope, SAD, mini-open RCR with patch graft  Dorothy Spark, PA-C for Dr Shelle Iron 04/29/2022, 1:23 PM

## 2022-04-29 NOTE — H&P (View-Only) (Signed)
George Graham is an 67 y.o. male.   Chief Complaint: R shoulder pain HPI: Visit For: Follow up; right shoulder MRI results Hand Dominance: right Location: right; shoulder Duration: 6 weeks; 6 weeks - 02/28/22 Severity: severe; severe pain with ROM - no pain while resting Timing: acute Context: fall (while running); 02/28/22 Alleviating Factors: rest; NSAIDs (meloxicam) Aggravating Factors: ROM Associated Symptoms: no numbness; no tingling; no swelling; no redness; no warmth; no ecchymosis; no drainage; no fever; no chills; weakness Previous Surgery: 06/17/11 - right subacromial decompression & RCR by Dr. Shelle Iron 06/18/16 - left shoulder arthroscopy: subacromial decompression & RCR by Dr. Shelle Iron Prior Imaging: x ray; MRI Previous Injections: helped temporarily  Past Medical History:  Diagnosis Date   ADHD (attention deficit hyperactivity disorder)    Angina 12/2010   Anxiety    Depression    Hard of hearing 12/15/2010   hears better on right side needs HAs,   Hypercholesteremia 12/15/2010   Hyperlipidemia    Hypertension    EKG, CHEST 12/12 EPIC   Pneumonia    Pre-diabetes    Recurrent upper respiratory infection (URI)    "cold vs allergies" 3 weeks ago- states resolved- no fever   Tobacco abuse 12/15/2010    Past Surgical History:  Procedure Laterality Date   COLONOSCOPY     LEFT HEART CATHETERIZATION WITH CORONARY ANGIOGRAM N/A 12/16/2010   Procedure: LEFT HEART CATHETERIZATION WITH CORONARY ANGIOGRAM;  Surgeon: Chrystie Nose, MD;  [facility] CATH LAB;  Service: Cardiovascular;  Laterality: N/A;   SHOULDER ARTHROSCOPY WITH ROTATOR CUFF REPAIR AND SUBACROMIAL DECOMPRESSION Left 06/18/2016   Procedure: Left shouler arthroscopy, subacromial decompression, mini open rotator cuff repair;  Surgeon: Jene Every, MD;  Location: [facility];  Service: Orthopedics;  Laterality: Left;  2 hrs   WISDOM TOOTH EXTRACTION      Family History  Problem Relation Age of Onset    Coronary artery disease Maternal Grandfather    Coronary artery disease Paternal Grandfather    Atrial fibrillation Mother    Cancer Father    Depression Brother    Prostate cancer Neg Hx    Colon cancer Neg Hx    Lung cancer Neg Hx    Social History:  reports that he quit smoking about 7 years ago. His smoking use included cigarettes. He has a 11.50 pack-year smoking history. He has never used smokeless tobacco. He reports that he does not drink alcohol and does not use drugs.  Allergies:  Allergies  Allergen Reactions   Statins Diarrhea and Nausea Only    Leg cramps/GI upset  Lipitor and Crestor   Current meds: FLUoxetine 40 mg capsule HYDROcodone 5 mg-acetaminophen 325 mg tablet lisinopriL 20 mg-hydrochlorothiazide 25 mg tablet meloxicam 15 mg tablet polymyxin B sulfate 10,000 unit-trimethoprim 1 mg/mL eye drops pravastatin 20 mg tablet tadalafiL 5 mg tablet Vitamins and Minerals  Review of Systems  Constitutional: Negative.   HENT: Negative.    Eyes: Negative.   Respiratory: Negative.    Cardiovascular: Negative.   Gastrointestinal: Negative.   Endocrine: Negative.   Genitourinary: Negative.   Musculoskeletal:  Positive for arthralgias and myalgias.  Skin: Negative.   Neurological: Negative.   Psychiatric/Behavioral: Negative.      There were no vitals taken for this visit. Physical Exam Constitutional:      Appearance: Normal appearance.  HENT:     Head: Normocephalic and atraumatic.     Right Ear: External ear normal.     Left Ear: External ear normal.  Nose: Nose normal.     Mouth/Throat:     Pharynx: Oropharynx is clear.  Eyes:     Conjunctiva/sclera: Conjunctivae normal.  Cardiovascular:     Rate and Rhythm: Normal rate.     Pulses: Normal pulses.     Heart sounds: Normal heart sounds.  Pulmonary:     Effort: Pulmonary effort is normal.     Breath sounds: Normal breath sounds.  Abdominal:     General: Bowel sounds are normal.   Musculoskeletal:     Cervical back: Normal range of motion.     Comments: He is nontender over the [facility] has a positive impingement sign positive second impingement sign is weak and external rotation slightly weak in internal rotation. Active abduction is 90 degrees forward flexion is to 130. There is pain associated with abduction  Skin:    General: Skin is warm and dry.  Neurological:     Mental Status: He is alert.    MRI of the shoulder demonstrates tears of the supraspinatus and infraspinatus. This is read as chronic with mild atrophy. There is high-grade tearing of the insertion of the subscap. With severe muscle atrophy. Moderate AC arthrosis mild glenohumeral arthrosis. Probable remote biceps rupture.   Assessment/Plan Impression:  1. 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 3. History of a biceps tendon rupture  Plan:  We discussed options living with his symptoms with activity modification which she has tried and has been unsuccessful. The other is to consider a revision rotator cuff repair with patch graft. He only has mild muscle atrophy. Only mild osteoarthritis of the shoulder. I discussed the salvage procedure which would be a reverse shoulder rate or a total shoulder replacement. He understands that. We also discussed the patch graft.  An extensive discussion concerning the pathology relevant anatomy and treatment options. After that discussion we mutually agreed to proceed with repair of the rotator cuff utilizing arthroscopic assistance if possible. The risks and benefits of that procedure were discussed including bleeding, infection, suboptimal range of motion, deep venous thrombosis, pulmonary embolism, anesthetic complications etc. in addition we discussed the postoperative course to include approximately 4 weeks of passive range of motion followed by 4 weeks of active  range of motion followed by 4-12 weeks of progressive strengthening exercises. In addition we discussed protective activities to reduce the risk of a reinjury including impingement activities with elbow above the shoulder as well as reaching and repetitive circular motion activities. The hospital stay will either be as a outpatient with a regional block versus overnight depending upon the extent of the procedure and any challenging health issues with a first postoperative visit 2 weeks following the surgery.   No history of DVT or MRSA. Able to take Kefzol. Will use oxycodone postoperatively outpatient. Preoperative clearance aspirin postoperatively for DVT prophylax  Plan Revision R shoulder scope, SAD, mini-open RCR with patch graft  Dorothy Spark, PA-C for Dr Shelle Iron 04/29/2022, 1:23 PM

## 2022-05-01 ENCOUNTER — Other Ambulatory Visit: Payer: Self-pay

## 2022-05-01 ENCOUNTER — Encounter (HOSPITAL_COMMUNITY): Payer: Self-pay | Admitting: Specialist

## 2022-05-01 ENCOUNTER — Encounter (HOSPITAL_COMMUNITY)
Admission: RE | Disposition: A | Discharge status: Home / Self Care | Payer: Self-pay | Source: Ambulatory Visit | Attending: Specialist

## 2022-05-01 ENCOUNTER — Ambulatory Visit (HOSPITAL_COMMUNITY)
Admission: RE | Admit: 2022-05-01 | Discharge: 2022-05-01 | Disposition: A | Discharge status: Home / Self Care | Payer: BC Managed Care – PPO | Source: Ambulatory Visit | Attending: Specialist | Admitting: Specialist

## 2022-05-01 ENCOUNTER — Ambulatory Visit (HOSPITAL_COMMUNITY): Payer: BC Managed Care – PPO | Admitting: Anesthesiology

## 2022-05-01 ENCOUNTER — Ambulatory Visit (HOSPITAL_COMMUNITY): Payer: BC Managed Care – PPO | Admitting: Physician Assistant

## 2022-05-01 DIAGNOSIS — I10 Essential (primary) hypertension: Secondary | ICD-10-CM | POA: Diagnosis not present

## 2022-05-01 DIAGNOSIS — Z9889 Other specified postprocedural states: Secondary | ICD-10-CM | POA: Diagnosis not present

## 2022-05-01 DIAGNOSIS — M19011 Primary osteoarthritis, right shoulder: Secondary | ICD-10-CM | POA: Insufficient documentation

## 2022-05-01 DIAGNOSIS — M75101 Unspecified rotator cuff tear or rupture of right shoulder, not specified as traumatic: Secondary | ICD-10-CM | POA: Diagnosis not present

## 2022-05-01 DIAGNOSIS — S46011A Strain of muscle(s) and tendon(s) of the rotator cuff of right shoulder, initial encounter: Secondary | ICD-10-CM | POA: Insufficient documentation

## 2022-05-01 DIAGNOSIS — W19XXXA Unspecified fall, initial encounter: Secondary | ICD-10-CM | POA: Diagnosis not present

## 2022-05-01 DIAGNOSIS — Z79899 Other long term (current) drug therapy: Secondary | ICD-10-CM | POA: Diagnosis not present

## 2022-05-01 DIAGNOSIS — S4381XA Sprain of other specified parts of right shoulder girdle, initial encounter: Secondary | ICD-10-CM | POA: Diagnosis not present

## 2022-05-01 DIAGNOSIS — G8918 Other acute postprocedural pain: Secondary | ICD-10-CM | POA: Diagnosis not present

## 2022-05-01 DIAGNOSIS — Z87891 Personal history of nicotine dependence: Secondary | ICD-10-CM | POA: Insufficient documentation

## 2022-05-01 DIAGNOSIS — M7551 Bursitis of right shoulder: Secondary | ICD-10-CM | POA: Diagnosis not present

## 2022-05-01 DIAGNOSIS — R7303 Prediabetes: Secondary | ICD-10-CM

## 2022-05-01 HISTORY — PX: SHOULDER ARTHROSCOPY WITH ROTATOR CUFF REPAIR AND SUBACROMIAL DECOMPRESSION: SHX5686

## 2022-05-01 LAB — GLUCOSE, CAPILLARY: Glucose-Capillary: 130 mg/dL — ABNORMAL HIGH (ref 70–99)

## 2022-05-01 SURGERY — SHOULDER ARTHROSCOPY WITH ROTATOR CUFF REPAIR AND SUBACROMIAL DECOMPRESSION
Anesthesia: General | Site: Shoulder | Laterality: Right

## 2022-05-01 MED ORDER — PROPOFOL 10 MG/ML IV BOLUS
INTRAVENOUS | Status: AC
  Filled 2022-05-01: qty 20

## 2022-05-01 MED ORDER — ACETAMINOPHEN 500 MG PO TABS
1000.0000 mg | ORAL_TABLET | Freq: Once | ORAL | Status: AC
  Administered 2022-05-01: 1000 mg via ORAL
  Filled 2022-05-01: qty 2

## 2022-05-01 MED ORDER — LIDOCAINE HCL (PF) 2 % IJ SOLN
INTRAMUSCULAR | Status: AC
  Filled 2022-05-01: qty 5

## 2022-05-01 MED ORDER — EPINEPHRINE PF 1 MG/ML IJ SOLN
INTRAMUSCULAR | Status: DC | PRN
  Administered 2022-05-01: 1 mg

## 2022-05-01 MED ORDER — FENTANYL CITRATE (PF) 100 MCG/2ML IJ SOLN
INTRAMUSCULAR | Status: AC
  Filled 2022-05-01: qty 2

## 2022-05-01 MED ORDER — ASPIRIN 81 MG PO TBEC: 81.0000 mg | DELAYED_RELEASE_TABLET | Freq: Two times a day (BID) | ORAL | 1 refills | Status: AC

## 2022-05-01 MED ORDER — ROCURONIUM BROMIDE 10 MG/ML (PF) SYRINGE
PREFILLED_SYRINGE | INTRAVENOUS | Status: DC | PRN
  Administered 2022-05-01: 70 mg via INTRAVENOUS

## 2022-05-01 MED ORDER — EPHEDRINE SULFATE-NACL 50-0.9 MG/10ML-% IV SOSY
PREFILLED_SYRINGE | INTRAVENOUS | Status: DC | PRN
  Administered 2022-05-01 (×2): 5 mg via INTRAVENOUS

## 2022-05-01 MED ORDER — CEFAZOLIN SODIUM-DEXTROSE 2-4 GM/100ML-% IV SOLN
2.0000 g | INTRAVENOUS | Status: AC
  Administered 2022-05-01: 2 g via INTRAVENOUS
  Filled 2022-05-01: qty 100

## 2022-05-01 MED ORDER — SODIUM CHLORIDE 0.9 % IR SOLN
Status: DC | PRN
  Administered 2022-05-01: 6000 mL

## 2022-05-01 MED ORDER — OXYCODONE HCL 5 MG PO TABS: 5.0000 mg | ORAL_TABLET | ORAL | 0 refills | Status: AC | PRN

## 2022-05-01 MED ORDER — FENTANYL CITRATE PF 50 MCG/ML IJ SOSY
50.0000 ug | PREFILLED_SYRINGE | INTRAMUSCULAR | Status: DC
  Administered 2022-05-01: 100 ug via INTRAVENOUS
  Filled 2022-05-01: qty 2

## 2022-05-01 MED ORDER — TRANEXAMIC ACID-NACL 1000-0.7 MG/100ML-% IV SOLN
1000.0000 mg | INTRAVENOUS | Status: AC
  Administered 2022-05-01: 1000 mg via INTRAVENOUS
  Filled 2022-05-01: qty 100

## 2022-05-01 MED ORDER — EPINEPHRINE PF 1 MG/ML IJ SOLN
INTRAMUSCULAR | Status: AC
  Filled 2022-05-01: qty 1

## 2022-05-01 MED ORDER — EPHEDRINE 5 MG/ML INJ
INTRAVENOUS | Status: AC
  Filled 2022-05-01: qty 5

## 2022-05-01 MED ORDER — MIDAZOLAM HCL 2 MG/2ML IJ SOLN
1.0000 mg | INTRAMUSCULAR | Status: DC
  Administered 2022-05-01: 2 mg via INTRAVENOUS
  Filled 2022-05-01: qty 2

## 2022-05-01 MED ORDER — PHENYLEPHRINE HCL-NACL 20-0.9 MG/250ML-% IV SOLN
INTRAVENOUS | Status: DC | PRN
  Administered 2022-05-01: 50 ug/min via INTRAVENOUS

## 2022-05-01 MED ORDER — DEXAMETHASONE SODIUM PHOSPHATE 10 MG/ML IJ SOLN
INTRAMUSCULAR | Status: AC
  Filled 2022-05-01: qty 1

## 2022-05-01 MED ORDER — ONDANSETRON HCL 4 MG/2ML IJ SOLN
INTRAMUSCULAR | Status: DC | PRN
  Administered 2022-05-01: 4 mg via INTRAVENOUS

## 2022-05-01 MED ORDER — AMISULPRIDE (ANTIEMETIC) 5 MG/2ML IV SOLN: 10.0000 mg | Freq: Once | INTRAVENOUS | Status: DC | PRN

## 2022-05-01 MED ORDER — DEXAMETHASONE SODIUM PHOSPHATE 10 MG/ML IJ SOLN
INTRAMUSCULAR | Status: DC | PRN
  Administered 2022-05-01: 10 mg via INTRAVENOUS

## 2022-05-01 MED ORDER — CHLORHEXIDINE GLUCONATE 0.12 % MT SOLN
15.0000 mL | Freq: Once | OROMUCOSAL | Status: AC
  Administered 2022-05-01: 15 mL via OROMUCOSAL

## 2022-05-01 MED ORDER — FENTANYL CITRATE (PF) 100 MCG/2ML IJ SOLN
INTRAMUSCULAR | Status: DC | PRN
  Administered 2022-05-01: 50 ug via INTRAVENOUS

## 2022-05-01 MED ORDER — SUGAMMADEX SODIUM 200 MG/2ML IV SOLN
INTRAVENOUS | Status: DC | PRN
  Administered 2022-05-01: 200 mg via INTRAVENOUS

## 2022-05-01 MED ORDER — PROMETHAZINE HCL 25 MG/ML IJ SOLN: 6.2500 mg | INTRAMUSCULAR | Status: DC | PRN

## 2022-05-01 MED ORDER — ONDANSETRON HCL 4 MG/2ML IJ SOLN
INTRAMUSCULAR | Status: AC
  Filled 2022-05-01: qty 2

## 2022-05-01 MED ORDER — FENTANYL CITRATE PF 50 MCG/ML IJ SOSY: 25.0000 ug | PREFILLED_SYRINGE | INTRAMUSCULAR | Status: DC | PRN

## 2022-05-01 MED ORDER — LACTATED RINGERS IV SOLN: INTRAVENOUS | Status: DC

## 2022-05-01 MED ORDER — CEPHALEXIN 500 MG PO CAPS: 500.0000 mg | ORAL_CAPSULE | Freq: Four times a day (QID) | ORAL | 1 refills | Status: AC

## 2022-05-01 MED ORDER — BUPIVACAINE HCL (PF) 0.5 % IJ SOLN
INTRAMUSCULAR | Status: DC | PRN
  Administered 2022-05-01: 15 mL via PERINEURAL

## 2022-05-01 MED ORDER — BUPIVACAINE-EPINEPHRINE 0.5% -1:200000 IJ SOLN
INTRAMUSCULAR | Status: DC | PRN
  Administered 2022-05-01: 30 mL

## 2022-05-01 MED ORDER — DOCUSATE SODIUM 100 MG PO CAPS: 100.0000 mg | ORAL_CAPSULE | Freq: Two times a day (BID) | ORAL | 1 refills | Status: AC | PRN

## 2022-05-01 MED ORDER — PHENYLEPHRINE HCL (PRESSORS) 10 MG/ML IV SOLN
INTRAVENOUS | Status: AC
  Filled 2022-05-01: qty 1

## 2022-05-01 MED ORDER — BUPIVACAINE-EPINEPHRINE (PF) 0.5% -1:200000 IJ SOLN
INTRAMUSCULAR | Status: AC
  Filled 2022-05-01: qty 30

## 2022-05-01 MED ORDER — ORAL CARE MOUTH RINSE: 15.0000 mL | Freq: Once | OROMUCOSAL | Status: AC

## 2022-05-01 MED ORDER — BUPIVACAINE LIPOSOME 1.3 % IJ SUSP
INTRAMUSCULAR | Status: DC | PRN
  Administered 2022-05-01: 10 mL via PERINEURAL

## 2022-05-01 MED ORDER — 0.9 % SODIUM CHLORIDE (POUR BTL) OPTIME
TOPICAL | Status: DC | PRN
  Administered 2022-05-01: 1000 mL

## 2022-05-01 MED ORDER — METHOCARBAMOL 500 MG PO TABS: 500.0000 mg | ORAL_TABLET | Freq: Three times a day (TID) | ORAL | 1 refills | Status: AC | PRN

## 2022-05-01 MED ORDER — PROPOFOL 10 MG/ML IV BOLUS
INTRAVENOUS | Status: DC | PRN
  Administered 2022-05-01: 170 mg via INTRAVENOUS

## 2022-05-01 SURGICAL SUPPLY — 79 items
AID PSTN UNV HD RSTRNT DISP (MISCELLANEOUS) ×1
ANCH SUT 2 SWLK 19.1 CLS EYLT (Anchor) ×1 IMPLANT
ANCH SUT FBRTAPE 1.3X2.6X1.7 (Anchor) ×1 IMPLANT
ANCHOR NDL 9/16 CIR SZ 8 (NEEDLE) IMPLANT
ANCHOR NEEDLE 9/16 CIR SZ 8 (NEEDLE) IMPLANT
ANCHOR SUT FBRTK 2.6 SOFT 1.7 (Anchor) IMPLANT
ANCHOR SWIVELOCK BIO 4.75X19.1 (Anchor) IMPLANT
ANCHOR SWIVELOCK SP KL 4.75 (Anchor) IMPLANT
BAG COUNTER SPONGE SURGICOUNT (BAG) IMPLANT
BAG SPNG CNTER NS LX DISP (BAG)
BLADE EXCALIBUR 4.0X13 (MISCELLANEOUS) IMPLANT
BLADE SHAVER TORPEDO 4X13 (MISCELLANEOUS) ×1 IMPLANT
BLADE SURG SZ11 CARB STEEL (BLADE) ×1 IMPLANT
BURR OVAL 8 FLU 4.0X13 (MISCELLANEOUS) IMPLANT
CANNULA 5.75X7 CRYSTAL CLEAR (CANNULA) IMPLANT
CANNULA ACUFO 5X76 (CANNULA) IMPLANT
CLEANER TIP ELECTROSURG 2X2 (MISCELLANEOUS) IMPLANT
CLSR STERI-STRIP ANTIMIC 1/2X4 (GAUZE/BANDAGES/DRESSINGS) IMPLANT
COVER SURGICAL LIGHT HANDLE (MISCELLANEOUS) ×1 IMPLANT
DRAPE FOOT SWITCH (DRAPES) ×2 IMPLANT
DRAPE IMP U-DRAPE 54X76 (DRAPES) ×1 IMPLANT
DRAPE ORTHO SPLIT 77X108 STRL (DRAPES) ×2
DRAPE POUCH INSTRU U-SHP 10X18 (DRAPES) ×1 IMPLANT
DRAPE STERI 35X30 U-POUCH (DRAPES) ×1 IMPLANT
DRAPE SURG ORHT 6 SPLT 77X108 (DRAPES) ×2 IMPLANT
DRSG AQUACEL AG ADV 3.5X 4 (GAUZE/BANDAGES/DRESSINGS) IMPLANT
DRSG AQUACEL AG ADV 3.5X 6 (GAUZE/BANDAGES/DRESSINGS) IMPLANT
DURAPREP 26ML APPLICATOR (WOUND CARE) ×1 IMPLANT
DW OUTFLOW CASSETTE/TUBE SET (MISCELLANEOUS) ×1 IMPLANT
ELECT NDL TIP 2.8 STRL (NEEDLE) ×1 IMPLANT
ELECT NEEDLE TIP 2.8 STRL (NEEDLE) ×1 IMPLANT
ELECT REM PT RETURN 15FT ADLT (MISCELLANEOUS) ×1 IMPLANT
FILTER STRAW (MISCELLANEOUS) ×1 IMPLANT
GAUZE PAD ABD 8X10 STRL (GAUZE/BANDAGES/DRESSINGS) IMPLANT
GLOVE BIOGEL PI IND STRL 7.0 (GLOVE) ×1 IMPLANT
GLOVE BIOGEL PI IND STRL 8 (GLOVE) ×1 IMPLANT
GLOVE SURG SS PI 8.0 STRL IVOR (GLOVE) ×2 IMPLANT
GOWN STRL REUS W/ TWL XL LVL3 (GOWN DISPOSABLE) ×2 IMPLANT
GOWN STRL REUS W/TWL XL LVL3 (GOWN DISPOSABLE) ×2
GRAFT TISS 40X70 3 THK DERM (Tissue) IMPLANT
KIT ANCHOR FBRTK 2.6 STR (KITS) IMPLANT
KIT BASIN OR (CUSTOM PROCEDURE TRAY) ×1 IMPLANT
KIT TURNOVER KIT A (KITS) IMPLANT
MANIFOLD NEPTUNE II (INSTRUMENTS) ×1 IMPLANT
NDL HD SCORPION MEGA LOADER (NEEDLE) IMPLANT
NDL SCORPION MULTI FIRE (NEEDLE) IMPLANT
NDL SPNL 18GX3.5 QUINCKE PK (NEEDLE) ×1 IMPLANT
NEEDLE SCORPION MULTI FIRE (NEEDLE) IMPLANT
NEEDLE SPNL 18GX3.5 QUINCKE PK (NEEDLE) ×1 IMPLANT
PACK SHOULDER (CUSTOM PROCEDURE TRAY) ×1 IMPLANT
PORT APPOLLO RF 90DEGREE MULTI (SURGICAL WAND) ×1 IMPLANT
PROTECTOR NERVE ULNAR (MISCELLANEOUS) ×1 IMPLANT
RESTRAINT HEAD UNIVERSAL NS (MISCELLANEOUS) ×1 IMPLANT
SLING ARM FOAM STRAP LRG (SOFTGOODS) IMPLANT
SLING ARM IMMOBILIZER LRG (SOFTGOODS) IMPLANT
SLING ARM IMMOBILIZER MED (SOFTGOODS) IMPLANT
SLING ULTRA II L (ORTHOPEDIC SUPPLIES) IMPLANT
STRIP CLOSURE SKIN 1/2X4 (GAUZE/BANDAGES/DRESSINGS) IMPLANT
SUCTION FRAZIER HANDLE 12FR (TUBING) ×1
SUCTION TUBE FRAZIER 12FR DISP (TUBING) ×1 IMPLANT
SUT ETHIBOND NAB CT1 #1 30IN (SUTURE) IMPLANT
SUT ETHILON 4 0 PS 2 18 (SUTURE) ×1 IMPLANT
SUT FIBERWIRE #2 38 T-5 BLUE (SUTURE)
SUT PROLENE 3 0 PS 2 (SUTURE) ×1 IMPLANT
SUT TIGER TAPE 7 IN WHITE (SUTURE) IMPLANT
SUT VIC AB 0 CT1 27 (SUTURE) ×1
SUT VIC AB 0 CT1 27XBRD ANTBC (SUTURE) ×1 IMPLANT
SUT VIC AB 1-0 CT2 27 (SUTURE) IMPLANT
SUT VIC AB 2-0 CT2 27 (SUTURE) IMPLANT
SUT VICRYL 0 UR6 27IN ABS (SUTURE) ×1 IMPLANT
SUTURE FIBERWR #2 38 T-5 BLUE (SUTURE) IMPLANT
SYR 20ML LL LF (SYRINGE) ×1 IMPLANT
TAPE FIBER 2MM 7IN #2 BLUE (SUTURE) IMPLANT
TISSUE ARTHOFLEX THICK 3MM (Tissue) ×1 IMPLANT
TOWEL OR 17X26 10 PK STRL BLUE (TOWEL DISPOSABLE) ×1 IMPLANT
TUBING ARTHROSCOPY IRRIG 16FT (MISCELLANEOUS) ×1 IMPLANT
TUBING CONNECTING 10 (TUBING) ×1 IMPLANT
WAND ABLATOR APOLLO I90 (BUR) IMPLANT
WIPE CHG 2% PREP (PERSONAL CARE ITEMS) ×1 IMPLANT

## 2022-05-01 NOTE — Interval H&P Note (Signed)
History and Physical Interval Note:  05/01/2022 12:07 PM  George Graham  has presented today for surgery, with the diagnosis of Recurrent right rotator cuff tear.  The various methods of treatment have been discussed with the patient and family. After consideration of risks, benefits and other options for treatment, the patient has consented to  Procedure(s): REVISION RIGHT SHOULDER ARTHROSCOPY WITH MINI OPEN ROTATOR CUFF REPAIR AND SUBACROMIAL DECOMPRESSION, POSSIBLE PATCH GRAFT (Right) as a surgical intervention.  The patient's history has been reviewed, patient examined, no change in status, stable for surgery.  I have reviewed the patient's chart and labs.  Questions were answered to the patient's satisfaction.     [name]   

## 2022-05-01 NOTE — Interval H&P Note (Signed)
History and Physical Interval Note:  05/01/2022 12:07 PM  Gibson Ramp  has presented today for surgery, with the diagnosis of Recurrent right rotator cuff tear.  The various methods of treatment have been discussed with the patient and family. After consideration of risks, benefits and other options for treatment, the patient has consented to  Procedure(s): REVISION RIGHT SHOULDER ARTHROSCOPY WITH MINI OPEN ROTATOR CUFF REPAIR AND SUBACROMIAL DECOMPRESSION, POSSIBLE PATCH GRAFT (Right) as a surgical intervention.  The patient's history has been reviewed, patient examined, no change in status, stable for surgery.  I have reviewed the patient's chart and labs.  Questions were answered to the patient's satisfaction.     Javier Docker

## 2022-05-01 NOTE — Anesthesia Preprocedure Evaluation (Addendum)
Anesthesia Evaluation  Patient identified by MRN, date of birth, ID band Patient awake    Reviewed: Allergy & Precautions, NPO status , Patient's Chart, lab work & pertinent test results  History of Anesthesia Complications Negative for: history of anesthetic complications  Airway Mallampati: II  TM Distance: >3 FB Neck ROM: Full    Dental no notable dental hx. (+) Dental Advisory Given   Pulmonary former smoker   Pulmonary exam normal        Cardiovascular hypertension, Pt. on medications Normal cardiovascular exam     Neuro/Psych  PSYCHIATRIC DISORDERS Anxiety Depression    negative neurological ROS     GI/Hepatic negative GI ROS, Neg liver ROS,,,  Endo/Other  negative endocrine ROS    Renal/GU negative Renal ROS     Musculoskeletal negative musculoskeletal ROS (+)    Abdominal   Peds  Hematology negative hematology ROS (+)   Anesthesia Other Findings   Reproductive/Obstetrics                              Anesthesia Physical Anesthesia Plan  ASA: 2  Anesthesia Plan: General   Post-op Pain Management: Regional block* and Tylenol PO (pre-op)*   Induction: Intravenous  PONV Risk Score and Plan: 2 and Ondansetron and Dexamethasone  Airway Management Planned: Oral ETT  Additional Equipment:   Intra-op Plan:   Post-operative Plan: Extubation in OR  Informed Consent: I have reviewed the patients History and Physical, chart, labs and discussed the procedure including the risks, benefits and alternatives for the proposed anesthesia with the patient or authorized representative who has indicated his/her understanding and acceptance.     Dental advisory given  Plan Discussed with: Anesthesiologist and CRNA  Anesthesia Plan Comments:         Anesthesia Quick Evaluation

## 2022-05-01 NOTE — Anesthesia Postprocedure Evaluation (Signed)
Anesthesia Post Note  Patient: JASHON ISHIDA  Procedure(s) Performed: REVISION RIGHT SHOULDER ARTHROSCOPY WITH MINI OPEN ROTATOR CUFF REPAIR AND SUBACROMIAL DECOMPRESSION, POSSIBLE PATCH GRAFT (Right: Shoulder)     Patient location during evaluation: PACU Anesthesia Type: General Level of consciousness: awake and alert, patient cooperative and oriented Pain management: pain level controlled Vital Signs Assessment: post-procedure vital signs reviewed and stable Respiratory status: spontaneous breathing, nonlabored ventilation and respiratory function stable Cardiovascular status: blood pressure returned to baseline and stable Postop Assessment: no apparent nausea or vomiting and able to ambulate Anesthetic complications: no   No notable events documented.  Last Vitals:  Vitals:   05/01/22 1600 05/01/22 1604  BP: 116/67   Pulse: 66 69  Resp: 15   Temp:    SpO2: 94% 93%    Last Pain:  Vitals:   05/01/22 1545  TempSrc:   PainSc: 0-No pain                 Adnan Vanvoorhis,E. Conya Ellinwood

## 2022-05-01 NOTE — Brief Op Note (Signed)
05/01/2022  12:08 PM  PATIENT:  Gibson Ramp  67 y.o. male  PRE-OPERATIVE DIAGNOSIS:  Recurrent right rotator cuff tear  POST-OPERATIVE DIAGNOSIS:  Recurrent right rotator cuff tear  PROCEDURE:  Procedure(s): REVISION RIGHT SHOULDER ARTHROSCOPY WITH MINI OPEN ROTATOR CUFF REPAIR AND SUBACROMIAL DECOMPRESSION, POSSIBLE PATCH GRAFT (Right) DIAGNOSES: Right shoulder, acute on chronic rotator cuff tear.  POST-OPERATIVE DIAGNOSIS: same  PROCEDURE: Arthroscopic limited debridement - 29822  Subacromial decompression including subacromial and subdeltoid bursectomy. Removal of retained suture. Mini open rotator cuff repair utilizing several lock suture anchors x 5 and a arthroflex allograft patch graft   OPERATIVE FINDING: Exam under anesthesia: Normal Articular space:  Normal visualized SURGEON:  Surgeon(s) and Role:    Jene Every, MD - Primary  PHYSICIAN ASSISTANT:   ASSISTANTSSu Hilt   ANESTHESIA:   general  EBL:  min   BLOOD ADMINISTERED:none  DRAINS: none   LOCAL MEDICATIONS USED:  MARCAINE     SPECIMEN:  No Specimen  DISPOSITION OF SPECIMEN:  N/A  COUNTS:  YES  TOURNIQUET:  * No tourniquets in log *  DICTATION: .Other Dictation: Dictation Number [id number]    PLAN OF CARE: Discharge to home after PACU  PATIENT DISPOSITION:  PACU - hemodynamically stable.   Delay start of Pharmacological VTE agent (>24hrs) due to surgical blood loss or risk of bleeding: no

## 2022-05-01 NOTE — Transfer of Care (Signed)
Immediate Anesthesia Transfer of Care Note  Patient: DEMITRIS POKORNY  Procedure(s) Performed: REVISION RIGHT SHOULDER ARTHROSCOPY WITH MINI OPEN ROTATOR CUFF REPAIR AND SUBACROMIAL DECOMPRESSION, POSSIBLE PATCH GRAFT (Right: Shoulder)  Patient Location: PACU  Anesthesia Type:GA combined with regional for post-op pain  Level of Consciousness: awake, alert , and oriented  Airway & Oxygen Therapy: Patient Spontanous Breathing and Patient connected to face mask oxygen  Post-op Assessment: Report given to RN and Post -op Vital signs reviewed and stable  Post vital signs: Reviewed and stable  Last Vitals:  Vitals Value Taken Time  BP 140/93 05/01/22 1524  Temp    Pulse 68 05/01/22 1525  Resp 19 05/01/22 1525  SpO2 99 % 05/01/22 1525  Vitals shown include unvalidated device data.  Last Pain:  Vitals:   05/01/22 1044  TempSrc:   PainSc: 1          Complications: No notable events documented.

## 2022-05-01 NOTE — Anesthesia Procedure Notes (Signed)
Procedure Name: Intubation Date/Time: 05/01/2022 12:24 PM  Performed by: Florene Route, CRNAPre-anesthesia Checklist: Patient identified, Emergency Drugs available, Suction available and Patient being monitored Patient Re-evaluated:Patient Re-evaluated prior to induction Oxygen Delivery Method: Circle system utilized Preoxygenation: Pre-oxygenation with 100% oxygen Induction Type: IV induction Ventilation: Mask ventilation without difficulty Laryngoscope Size: Miller and 3 Grade View: Grade I Tube type: Oral Tube size: 8.0 mm Number of attempts: 1 Airway Equipment and Method: Stylet and Oral airway Placement Confirmation: ETT inserted through vocal cords under direct vision, positive ETCO2 and breath sounds checked- equal and bilateral Secured at: 22 cm Tube secured with: Tape Dental Injury: Teeth and Oropharynx as per pre-operative assessment

## 2022-05-01 NOTE — Anesthesia Procedure Notes (Signed)
Anesthesia Regional Block: Interscalene brachial plexus block   Pre-Anesthetic Checklist: , timeout performed,  Correct Patient, Correct Site, Correct Laterality,  Correct Procedure, Correct Position, site marked,  Risks and benefits discussed,  Surgical consent,  Pre-op evaluation,  At surgeon's request and post-op pain management  Laterality: Right  Prep: chloraprep       Needles:  Injection technique: Single-shot  Needle Type: Echogenic Stimulator Needle     Needle Length: 5cm  Needle Gauge: 22     Additional Needles:   Narrative:  Start time: 05/01/2022 11:49 AM End time: 05/01/2022 11:59 AM Injection made incrementally with aspirations every 5 mL.  Performed by: Personally  Anesthesiologist: Heather Roberts, MD  Additional Notes: Functioning IV was confirmed and monitors applied.  A 50mm 22ga echogenic arrow stimulator was used. Sterile prep and drape,hand hygiene and sterile gloves were used.Ultrasound guidance: relevant anatomy identified, needle position confirmed, local anesthetic spread visualized around nerve(s)., vascular puncture avoided.  Image printed for medical record.  Negative aspiration and negative test dose prior to incremental administration of local anesthetic. The patient tolerated the procedure well.

## 2022-05-02 NOTE — Op Note (Unsigned)
NAME: George Graham, George Graham MEDICAL RECORD NO: [id number] ACCOUNT NO: [id number] DATE OF BIRTH: [birth year] FACILITY: [facility] LOCATION: [facility] PHYSICIAN: Javier Docker, MD  Operative Report   DATE OF PROCEDURE: 05/01/2022  SURGEON:  Javier Docker, MD  PREOPERATIVE DIAGNOSIS:  Recurrent rotator cuff tear, right shoulder, status post rotator cuff repair.  POSTOPERATIVE DIAGNOSIS:  Recurrent rotator cuff tear, right shoulder, status post rotator cuff repair, acute on chronic tear.  PROCEDURE PERFORMED: 1.  Revision rotator cuff repair utilizing Arthrex SwiveLock suture anchors. 2.  Excision of retained suture, right shoulder. 3.  Subacromial decompression with bursectomy. 4.  Patch graft applied to the rotator cuff tear (Arthrex allograft patch graft).  ANESTHESIA:  General with regional block.  ASSISTANT:  Skip Mayer, PA.  HISTORY:  The patient has history of rotator cuff repair 6 years ago.  He has recurrent tear of the rotator cuff, supraspinatus, infraspinatus retracted to the humeral head, felt to be repairable; acute on chronic retained Mitek suture anchors.  He was  indicated for repair of the recurrent tear.  Risks and benefits discussed including bleeding, infection, damage to neurovascular structures, no change in symptoms, worsening symptoms, DVT, PE, anesthetic complications, etc.  We also discussed the  utilization of patch graft.  TECHNIQUE:  With the patient in supine beach chair position, after induction of adequate general anesthesia, 2 grams Kefzol, the right shoulder and upper extremity was prepped and draped in the usual sterile fashion.  Range of motion of the shoulder was  noted to be normal.  A surgical marker was utilized to delineate the acromion and AC joint coracoid.  A standard posterolateral portal was utilized with incision through the skin only with a #11 blade.  Arthroscopic camera was then inserted into the  subacromial space.  I triangulated with a  portal from the anterolateral aspect of the acromion with an incision through the skin only.  The subacromial space full thickness retracted tear of the rotator cuff was noted.  Debrided the edge of the  supraspinatus and infraspinous.  I also debrided the labrum.  The biceps tendon was absent.  I removed the Ethibond suture from the previous repair.  Acromioplasty had previously been performed.  This was a full thickness retracted tear.  Converted to  mini open repair, removed all portals.  We removed all instrumentation.  Portals were closed with 4-0 nylon simple suture.  A 3 cm incision was made over the anterolateral aspect of the acromion in the previous surgical site.  Subcutaneous tissue was  dissected.  Electrocautery was utilized to achieve hemostasis.  Raphae between the anterior and lateral heads was divided in line with skin incision.  A self-retaining retractor was then placed and I entered the subacromial space.  I then located the  retracted tear to the mid portion of the humeral head.  This was mobilized on its articular and bursal surfaces utilizing a Cobb and Freers.  This was advanced to the proximal portion of the greater tuberosity.  A Beyer rongeur was utilized to  decorticate and perform a bleeding bed in the greater tuberosity.  I removed approximately 0.5 cm of chondral surface to allow greater footprint for the ***cuff, which did not mobilize to the lateral aspect of the greater tuberosity.  Following this, I  placed a center self-tapping Arthrex anchor just lateral to the articular surface in the mid portion of the tear.  I passed it through the tear with a Scorpion suture passer and then used that to mobilize  and advanced the rotator cuff.  I then placed 2  SwiveLock suture anchors anterior and posterior to that first anchor.  Again, we used an awl to fashion a pilot hole.  We placed TigerTape in each of the SwiveLocks, we inserted them fully with excellent resistance to pullout.   The Scorpion suture passer  was utilized to pass the 2 suture ends through the cuff, the anterior one through the anterior portion of the tear in the supraspinatus and posterior one through the posterior portion of the infraspinatus and supraspinatus.  I felt the best  configuration was to cross these and then fixed them over to the lateral aspect of the greater tuberosity in a double row fashion.  I felt given the nature of the tendon that it was best to be augment it with a graft.  We selected the Arthrex allograft.   We selected a 2.5 x 3 cm graft.  The proximal medial portions of the graft were then affixed to one end of the SwiveLock anchor suture ends.  I then translated the graft down to the surface of the leading edge of the rotator cuff, which had been  debrided previously.  There was overlap of approximately a centimeter.  I then crossed and sutured from the posterior SwiveLock and the anterior SwiveLock and then used a suture from the posterior and the mid anchor to be anchored in the posterior double  row SwiveLock after fashioning the pilot hole with an awl with the arm in the neutral position, slightly abducted.  I then advanced the SwiveLock with excellent purchase.  No undue tension.  The arm is minimally abducted at that point in time.  I also  placed a similar crossover second row SwiveLock anterior to the midline of the repair after fashioning the pilot hole with an awl and inserting the leaflets into the SwiveLock.  This was advanced without undue tension.  Excellent resistance to pullout.   For both the anterior and the posterior SwiveLocks, we took the rescue suture and threaded it through the proximal aspect of the graft to fully tether it and this fixed the lateral ends both anteriorly and posteriorly of the graft.  The graft covered the  entirety of the greater tuberosity in its bed.  Again, augmenting the gap between the leading edge of the rotator cuff and the lateral aspect of the  greater tuberosity.  Redundant suture was then removed.  Copiously irrigated, full coverage was noted.  Initially, we had challenge with the arthroscopic equipment in terms of the suction.  This required an attempt at re-tubing the suction which ultimately was unsuccessful.  Fortunately, the bursectomy in the subacromial portion had been performed  arthroscopically.  The patient tolerated the procedure well with no complications.  Then, after copious irrigation, closed the raphae with 1 Vicryl in interrupted figure-of-eight sutures, subcutaneous with 2-0 and skin with subcuticular Prolene.  Sterile dressing was  applied.  Placed in abduction pillow and extubated without difficulty and transported to the recovery room in satisfactory condition.  The patient tolerated the procedure well.  No complications.  ASSISTANT: Skip Mayer, PA.  BLOOD LOSS: 20 mL.   NIK D: 05/01/2022 3:21:23 pm T: 05/02/2022 3:35:00 am  JOB: [id number]/ [id number]

## 2022-05-05 ENCOUNTER — Encounter (HOSPITAL_COMMUNITY): Payer: Self-pay | Admitting: Specialist

## 2022-05-21 DIAGNOSIS — M25511 Pain in right shoulder: Secondary | ICD-10-CM | POA: Diagnosis not present

## 2022-05-27 DIAGNOSIS — M25511 Pain in right shoulder: Secondary | ICD-10-CM | POA: Diagnosis not present

## 2022-05-29 DIAGNOSIS — M25511 Pain in right shoulder: Secondary | ICD-10-CM | POA: Diagnosis not present

## 2022-06-02 DIAGNOSIS — M25511 Pain in right shoulder: Secondary | ICD-10-CM | POA: Diagnosis not present

## 2022-06-04 DIAGNOSIS — M25511 Pain in right shoulder: Secondary | ICD-10-CM | POA: Diagnosis not present

## 2022-06-09 DIAGNOSIS — M25511 Pain in right shoulder: Secondary | ICD-10-CM | POA: Diagnosis not present

## 2022-06-11 DIAGNOSIS — M25511 Pain in right shoulder: Secondary | ICD-10-CM | POA: Diagnosis not present

## 2022-06-17 DIAGNOSIS — M25511 Pain in right shoulder: Secondary | ICD-10-CM | POA: Diagnosis not present

## 2022-06-19 DIAGNOSIS — M25511 Pain in right shoulder: Secondary | ICD-10-CM | POA: Diagnosis not present

## 2022-06-23 DIAGNOSIS — M25511 Pain in right shoulder: Secondary | ICD-10-CM | POA: Diagnosis not present

## 2022-06-25 DIAGNOSIS — M25511 Pain in right shoulder: Secondary | ICD-10-CM | POA: Diagnosis not present

## 2022-06-29 DIAGNOSIS — M25511 Pain in right shoulder: Secondary | ICD-10-CM | POA: Diagnosis not present

## 2022-07-01 DIAGNOSIS — M25511 Pain in right shoulder: Secondary | ICD-10-CM | POA: Diagnosis not present

## 2022-07-06 DIAGNOSIS — M25511 Pain in right shoulder: Secondary | ICD-10-CM | POA: Diagnosis not present

## 2022-07-08 DIAGNOSIS — M25511 Pain in right shoulder: Secondary | ICD-10-CM | POA: Diagnosis not present

## 2022-07-30 ENCOUNTER — Other Ambulatory Visit: Payer: Self-pay | Admitting: Family Medicine

## 2022-07-30 ENCOUNTER — Encounter: Payer: Self-pay | Admitting: Family Medicine

## 2022-07-30 DIAGNOSIS — Z136 Encounter for screening for cardiovascular disorders: Secondary | ICD-10-CM

## 2022-08-06 ENCOUNTER — Ambulatory Visit
Admission: RE | Admit: 2022-08-06 | Discharge: 2022-08-06 | Disposition: A | Discharge status: Home / Self Care | Payer: Medicare Other | Source: Ambulatory Visit | Attending: Family Medicine | Admitting: Family Medicine

## 2022-08-06 ENCOUNTER — Other Ambulatory Visit: Payer: Self-pay | Admitting: Emergency Medicine

## 2022-08-06 DIAGNOSIS — Z87891 Personal history of nicotine dependence: Secondary | ICD-10-CM

## 2022-08-06 DIAGNOSIS — Z122 Encounter for screening for malignant neoplasm of respiratory organs: Secondary | ICD-10-CM

## 2022-08-06 DIAGNOSIS — Z136 Encounter for screening for cardiovascular disorders: Secondary | ICD-10-CM

## 2022-09-02 ENCOUNTER — Ambulatory Visit
Admission: RE | Admit: 2022-09-02 | Discharge: 2022-09-02 | Disposition: A | Discharge status: Home / Self Care | Payer: Medicare Other | Source: Ambulatory Visit | Attending: Acute Care | Admitting: Acute Care

## 2022-09-02 ENCOUNTER — Ambulatory Visit: Payer: Medicare Other | Admitting: Acute Care

## 2022-09-02 ENCOUNTER — Ambulatory Visit (INDEPENDENT_AMBULATORY_CARE_PROVIDER_SITE_OTHER): Payer: Medicare Other | Admitting: Pulmonary Disease

## 2022-09-02 DIAGNOSIS — Z87891 Personal history of nicotine dependence: Secondary | ICD-10-CM

## 2022-09-02 DIAGNOSIS — Z122 Encounter for screening for malignant neoplasm of respiratory organs: Secondary | ICD-10-CM | POA: Diagnosis not present

## 2022-09-02 NOTE — Progress Notes (Signed)
Shared Decision Making Visit Lung Cancer Screening Program [phone])   Eligibility: Age 67 y.o. Pack Years Smoking History Calculation 39 years  (# packs/per year x # years smoked) Recent History of coughing up blood  no Unexplained weight loss? no ( >Than 15 pounds within the last 6 months ) Prior History Lung / other cancer no (Diagnosis within the last 5 years already requiring surveillance chest CT Scans). Smoking Status Former Smoker Former Smokers: Years since quit: 7 years  Quit Date: May 2017  Visit Components: Discussion included one or more decision making aids. yes Discussion included risk/benefits of screening. yes Discussion included potential follow up diagnostic testing for abnormal scans. yes Discussion included meaning and risk of over diagnosis. yes Discussion included meaning and risk of False Positives. yes Discussion included meaning of total radiation exposure. yes  Counseling Included: Importance of adherence to annual lung cancer LDCT screening. yes Impact of comorbidities on ability to participate in the program. yes Ability and willingness to under diagnostic treatment. yes   Josephine Igo, DO

## 2022-09-11 ENCOUNTER — Other Ambulatory Visit: Payer: Self-pay | Admitting: Acute Care

## 2022-09-11 DIAGNOSIS — Z87891 Personal history of nicotine dependence: Secondary | ICD-10-CM

## 2022-09-11 DIAGNOSIS — Z122 Encounter for screening for malignant neoplasm of respiratory organs: Secondary | ICD-10-CM

## 2023-04-30 ENCOUNTER — Other Ambulatory Visit: Payer: Self-pay | Admitting: Medical Genetics

## 2023-05-06 ENCOUNTER — Other Ambulatory Visit (HOSPITAL_COMMUNITY)

## 2023-08-03 ENCOUNTER — Ambulatory Visit (HOSPITAL_BASED_OUTPATIENT_CLINIC_OR_DEPARTMENT_OTHER)
Admission: RE | Admit: 2023-08-03 | Discharge: 2023-08-03 | Disposition: A | Discharge status: Home / Self Care | Payer: Self-pay | Source: Ambulatory Visit | Attending: Acute Care | Admitting: Acute Care

## 2023-08-03 DIAGNOSIS — Z122 Encounter for screening for malignant neoplasm of respiratory organs: Secondary | ICD-10-CM | POA: Insufficient documentation

## 2023-08-03 DIAGNOSIS — Z87891 Personal history of nicotine dependence: Secondary | ICD-10-CM | POA: Diagnosis present

## 2023-08-11 ENCOUNTER — Other Ambulatory Visit: Payer: Self-pay

## 2023-08-11 DIAGNOSIS — Z122 Encounter for screening for malignant neoplasm of respiratory organs: Secondary | ICD-10-CM

## 2023-08-11 DIAGNOSIS — Z87891 Personal history of nicotine dependence: Secondary | ICD-10-CM

## 2023-11-06 ENCOUNTER — Other Ambulatory Visit: Payer: Self-pay

## 2023-11-06 ENCOUNTER — Encounter (HOSPITAL_COMMUNITY): Payer: Self-pay | Admitting: Emergency Medicine

## 2023-11-06 ENCOUNTER — Emergency Department (HOSPITAL_COMMUNITY)
Admission: EM | Admit: 2023-11-06 | Discharge: 2023-11-06 | Disposition: A | Discharge status: Home / Self Care | Attending: Emergency Medicine | Admitting: Emergency Medicine

## 2023-11-06 DIAGNOSIS — Z7982 Long term (current) use of aspirin: Secondary | ICD-10-CM | POA: Diagnosis not present

## 2023-11-06 DIAGNOSIS — E861 Hypovolemia: Secondary | ICD-10-CM | POA: Diagnosis present

## 2023-11-06 DIAGNOSIS — E860 Dehydration: Secondary | ICD-10-CM | POA: Diagnosis not present

## 2023-11-06 LAB — CBC WITH DIFFERENTIAL/PLATELET
Abs Immature Granulocytes: 0.07 K/uL (ref 0.00–0.07)
Basophils Absolute: 0 K/uL (ref 0.0–0.1)
Basophils Relative: 0 %
Eosinophils Absolute: 0 K/uL (ref 0.0–0.5)
Eosinophils Relative: 0 %
HCT: 41.4 % (ref 39.0–52.0)
Hemoglobin: 12.7 g/dL — ABNORMAL LOW (ref 13.0–17.0)
Immature Granulocytes: 1 %
Lymphocytes Relative: 6 %
Lymphs Abs: 0.9 K/uL (ref 0.7–4.0)
MCH: 30.5 pg (ref 26.0–34.0)
MCHC: 30.7 g/dL (ref 30.0–36.0)
MCV: 99.3 fL (ref 80.0–100.0)
Monocytes Absolute: 1.2 K/uL — ABNORMAL HIGH (ref 0.1–1.0)
Monocytes Relative: 7 %
Neutro Abs: 13.3 K/uL — ABNORMAL HIGH (ref 1.7–7.7)
Neutrophils Relative %: 86 %
Platelets: 271 K/uL (ref 150–400)
RBC: 4.17 MIL/uL — ABNORMAL LOW (ref 4.22–5.81)
RDW: 13.3 % (ref 11.5–15.5)
WBC: 15.5 K/uL — ABNORMAL HIGH (ref 4.0–10.5)
nRBC: 0 % (ref 0.0–0.2)

## 2023-11-06 LAB — COMPREHENSIVE METABOLIC PANEL WITH GFR
ALT: 22 U/L (ref 0–44)
AST: 41 U/L (ref 15–41)
Albumin: 4.3 g/dL (ref 3.5–5.0)
Alkaline Phosphatase: 52 U/L (ref 38–126)
Anion gap: 16 — ABNORMAL HIGH (ref 5–15)
BUN: 42 mg/dL — ABNORMAL HIGH (ref 8–23)
CO2: 23 mmol/L (ref 22–32)
Calcium: 10.1 mg/dL (ref 8.9–10.3)
Chloride: 100 mmol/L (ref 98–111)
Creatinine, Ser: 1.64 mg/dL — ABNORMAL HIGH (ref 0.61–1.24)
GFR, Estimated: 46 mL/min — ABNORMAL LOW (ref >60)
Glucose, Bld: 84 mg/dL (ref 70–99)
Potassium: 4.2 mmol/L (ref 3.5–5.1)
Sodium: 140 mmol/L (ref 135–145)
Total Bilirubin: 0.7 mg/dL (ref 0.0–1.2)
Total Protein: 7.3 g/dL (ref 6.5–8.1)

## 2023-11-06 LAB — CK: Total CK: 655 U/L — ABNORMAL HIGH (ref 49–397)

## 2023-11-06 LAB — CBG MONITORING, ED: Glucose-Capillary: 99 mg/dL (ref 70–99)

## 2023-11-06 LAB — MAGNESIUM: Magnesium: 2.3 mg/dL (ref 1.7–2.4)

## 2023-11-06 MED ORDER — LACTATED RINGERS IV BOLUS
1000.0000 mL | Freq: Once | INTRAVENOUS | Status: AC
  Administered 2023-11-06: 1000 mL via INTRAVENOUS

## 2023-11-06 MED ORDER — SODIUM CHLORIDE 0.9 % IV BOLUS
1000.0000 mL | Freq: Once | INTRAVENOUS | Status: AC
  Administered 2023-11-06: 1000 mL via INTRAVENOUS

## 2023-11-06 NOTE — Discharge Instructions (Signed)
 Be sure to stay well-hydrated, and monitor your condition carefully.  Return here for concerning changes in your condition.

## 2023-11-06 NOTE — ED Provider Notes (Signed)
 [place] EMERGENCY DEPARTMENT AT [facility] Provider Note   CSN: [id number] Arrival date & time: 11/06/23  1523     Patient presents with: Weakness   George Graham is a 68 y.o. male.   HPI Presents as wife who provides much of the history. When the wife is out of the room she notes that patient has recently had decreased cognitive function, repetitive speech, going back months she has some concern for cognitive decline.  Today the patient notes that he went for a run, which he typically does.  Typically the patient runs approximately 8 miles.  Today he decided to run 13 miles.  Following this run he felt crampiness, weakness, had to lay on the floor, had minimal improvement with pickle juice, and now presents for evaluation. No focal pain currently, no fall, no syncope, no dyspnea.    Prior to Admission medications   Medication Sig Start Date End Date Taking? Authorizing Provider  aspirin  EC 81 MG tablet Take 1 tablet (81 mg total) by mouth 2 (two) times daily after a meal. Day after surgery 05/01/22   Duwayne Purchase, MD  b complex vitamins capsule Take 1 capsule by mouth daily.    [provider]  cephALEXin  (KEFLEX ) 500 MG capsule Take 1 capsule (500 mg total) by mouth 4 (four) times daily. Starting 6 hours after scheduled surgery time 05/01/22   Duwayne Purchase, MD  Cholecalciferol (VITAMIN D) 50 MCG (2000 UT) CAPS Take 2,000 Units by mouth daily.    [provider]  docusate sodium  (COLACE) 100 MG capsule Take 1 capsule (100 mg total) by mouth 2 (two) times daily as needed for mild constipation. 05/01/22   Duwayne Purchase, MD  FLUoxetine  (PROZAC ) 40 MG capsule Take 40 mg by mouth daily.    [provider]  lisinopril -hydrochlorothiazide  (PRINZIDE ,ZESTORETIC ) 20-25 MG per tablet Take 1 tablet by mouth daily.    [provider]  methocarbamol  (ROBAXIN ) 500 MG tablet Take 1 tablet (500 mg total) by mouth every 8 (eight) hours as needed for  muscle spasms. 05/01/22   Duwayne Purchase, MD  Multiple Vitamins-Minerals (CENTRUM SILVER 50+MEN) TABS Take 1 tablet by mouth daily.    [provider]  Naphazoline HCl (CLEAR EYES OP) Place 1 drop into both eyes in the morning.    [provider]  Omega-3 Fatty Acids (FISH OIL) 1000 MG CAPS Take 1,000 mg by mouth daily.    [provider]  oxyCODONE  (OXY IR/ROXICODONE ) 5 MG immediate release tablet Take 1 tablet (5 mg total) by mouth every 4 (four) hours as needed for severe pain. 05/01/22   Duwayne Purchase, MD  pravastatin (PRAVACHOL) 20 MG tablet Take 20 mg by mouth at bedtime.    [provider]  tadalafil (CIALIS) 5 MG tablet Take 5 mg by mouth daily.    [provider]  vitamin E 45 MG (100 UNITS) capsule Take 100 Units by mouth daily.    [provider]    Allergies: Statins    Review of Systems  Updated Vital Signs BP 109/65   Pulse 71   Temp (!) 97.1 F (36.2 C) (Rectal)   Resp 14   SpO2 99%   Physical Exam Skin:    Comments: Grossly dehydrated with dry mucous membranes  Neurological:     Comments: Patient with slightly repetitive, perseverant speech, otherwise grossly unremarkable psychiatric, neuroexam.     (all labs ordered are listed, but only abnormal results are displayed) Labs Reviewed  CBC WITH DIFFERENTIAL/PLATELET - Abnormal; Notable for the following components:      Result Value   WBC 15.5 (*)    RBC 4.17 (*)    Hemoglobin 12.7 (*)    Neutro Abs 13.3 (*)    Monocytes Absolute 1.2 (*)    All other components within normal limits  CK - Abnormal; Notable for the following components:   Total CK 655 (*)    All other components within normal limits  COMPREHENSIVE METABOLIC PANEL WITH GFR - Abnormal; Notable for the following components:   BUN 42 (*)    Creatinine, Ser 1.64 (*)    GFR, Estimated 46 (*)    Anion gap 16 (*)    All other components within normal limits  MAGNESIUM  CBG MONITORING, ED     EKG: None  Radiology: No results found.   Procedures   Medications Ordered in the ED  lactated ringers  bolus 1,000 mL (has no administration in time range)  sodium chloride  0.9 % bolus 1,000 mL (0 mLs Intravenous Stopped 11/06/23 1717)                                    Medical Decision Making Elderly male presents after running in a typical amount of miles.  Patient has gross evidence for dehydration on exam, is hypotensive, 70/50, there is concern for hypotension, hypovolemia, rhabdomyolysis contributing to his presentation. No fall, trauma, dyspnea somewhat reassuring. Patient seemingly has baseline decrease in cognitive capacity as well. Patient received fluid resuscitation empirically, continuous cardiac monitoring, labs. Cardiac 80 sinus normal pulse ox 99% room air normal  Amount and/or Complexity of Data Reviewed Independent Historian: spouse Labs: ordered. Decision-making details documented in ED Course.  Risk Prescription drug management. Decision regarding hospitalization. Diagnosis or treatment significantly limited by social determinants of health.  On first repeat evaluation patient's blood pressure is now improved, MAP greater than 70.  Update:, Initial vitals now nearly normal   7:11 PM CK6 55, consistent with dehydration.  Patient has received 1+ liter fluid resuscitation, vitals are normal, no arrhythmia, patient is feeling better, smiling.  Absent fever, evidence for infection, low suspicion for bacteremia/sepsis/septic shock. With concern for dehydration secondary to overexertion, now with substantial improvement following fluid resuscitation, patient will follow-up with primary care.  Patient will require additional evaluation of his recent decline in cognitive function with his primary care physician.  Final diagnoses:  Hypotension due to hypovolemia  Dehydration    ED Discharge Orders     None          Garrick Charleston, MD 11/06/23  [phone]

## 2023-11-06 NOTE — ED Triage Notes (Signed)
 Pt reports cramping in his arms and legs after running 13 miles this morning. Pt reports running long distance is normal for him. Has not urinated since the run.

## 2023-11-06 NOTE — ED Notes (Signed)
 I attempted to collect labs and was unsuccessful.

## 2023-11-11 ENCOUNTER — Other Ambulatory Visit: Payer: Self-pay | Admitting: Family Medicine

## 2023-11-11 ENCOUNTER — Other Ambulatory Visit: Payer: Self-pay | Admitting: Medical Genetics

## 2023-11-11 DIAGNOSIS — R413 Other amnesia: Secondary | ICD-10-CM

## 2023-11-11 DIAGNOSIS — Z006 Encounter for examination for normal comparison and control in clinical research program: Secondary | ICD-10-CM

## 2023-11-25 ENCOUNTER — Ambulatory Visit
Admission: RE | Admit: 2023-11-25 | Discharge: 2023-11-25 | Disposition: A | Discharge status: Home / Self Care | Source: Ambulatory Visit | Attending: Family Medicine | Admitting: Family Medicine

## 2023-11-25 DIAGNOSIS — R413 Other amnesia: Secondary | ICD-10-CM

## 2023-11-25 MED ORDER — GADOPICLENOL 0.5 MMOL/ML IV SOLN
8.5000 mL | Freq: Once | INTRAVENOUS | Status: AC | PRN
  Administered 2023-11-25: 8.5 mL via INTRAVENOUS

## 2023-11-29 ENCOUNTER — Encounter: Payer: Self-pay | Admitting: Physician Assistant

## 2023-12-08 NOTE — Progress Notes (Addendum)
 Assessment & Plan  Memory impairment of unclear etiology   Short-term memory issues since October 25th, 2025, following a 13-mile run resulting in dehydration and rhabdomyolysis. MRI shows frontotemporal volume reduction. MoCA today is 29/30. Etiology of memory loss is unclear, workup in progress. Patient is able to participate on ADLs and to drive without difficulties.   - Ordered neurocognitive testing to assess memory function and rule out any additional contributions from attention, sleep, mood, etc. - Checked TSH  - Recommended audiology testing to assess hearing   - Referred to psychiatry for evaluation and management of ADD.  Attention-deficit hyperactivity disorder ADD with current treatment of methylphenidate without significant response. Symptoms include flight of ideas and difficulty focusing. Potential for improved memory function with optimized treatment. - Referred to psychiatry for evaluation and management of ADD.  Sensorineural hearing loss Hearing loss, particularly in the left ear, likely due to lack of hearing protection during shooting with hunting guns. Hearing loss may contribute to memory issues by affecting comprehension. - Recommended audiology testing to assess hearing and potential need for hearing aids.  Discussed the use of AI scribe software for clinical note transcription with the patient, who gave verbal consent to proceed.      History of Present Illness George Graham is a very pleasant 68 y.o. year old RH male with a history of hypertension, hyperlipidemia, HOH, depression, anxiety, prior tobacco and alcohol abuse, ADD,DM2 diet controlled  seen today for evaluation of memory loss. The patient is accompanied by his wife who supplement  the history.  He has been experiencing memory difficulties, particularly with short-term memory, since October 25th He had slurred speech and difficulty recalling information. He was told in the ED that he had  rhabdomyolysis after running 13 miles, resulting in dehydration and low blood pressure. Severe muscle cramps occurred post-run, which he attempted to alleviate with pickle juice and sparkling water before seeking medical attention. Workup negative for stroke.    He frequently forgets items when shopping and needs reminders for tasks. His wife confirms these changes, noting he began around the same time. Long-term memory remains intact.   He has a history of ADD, for which he is currently taking methylphenidate. He has previously been on Ritalin but discontinued it due to side effects. He also has a history of depression, for which he takes fluoxetine . He sleeps well, aided by Klonopin for anxiety, which he takes primarily for sleep. No significant changes in personality or behavior, hallucinations, or paranoia.  He has a history of hypertension, hyperlipidemia, and diabetes, though he is not currently on medication for diabetes. He reports dietary changes, alternating between high and low carb diets, and has been trying to reduce soft drink consumption. He has a past history of heavy alcohol use, having quit 15 years ago, and a history of tobacco use, which he quit 8 years ago.  There is a family history of dementia, with his mother having dementia linked to Parkinson's and a first cousin with early-onset dementia. He denies any personal history of seizures, migraines, or chronic pain, though he has had multiple surgeries, including shoulder surgeries and cervical surgery. Occasional discomfort in his shoulder affects his sleep.  He is independent in daily activities, though he occasionally forgets to take medications. No significant issues with finances or driving. He has a history of hearing loss, for which he has used hearing aids, and is currently seeking audiology services.   Past Medical History:  Diagnosis Date  ADHD (attention deficit hyperactivity disorder)    Angina 12/2010   Anxiety     Depression    Hard of hearing 12/15/2010   hears better on right side needs HAs,   Hypercholesteremia 12/15/2010   Hyperlipidemia    Hypertension    EKG, CHEST 12/12 [facility]   Pneumonia    Pre-diabetes    Recurrent upper respiratory infection (URI)    cold vs allergies 3 weeks ago- states resolved- no fever   Tobacco abuse 12/15/2010     Past Surgical History:  Procedure Laterality Date   COLONOSCOPY     LEFT HEART CATHETERIZATION WITH CORONARY ANGIOGRAM N/A 12/16/2010   Procedure: LEFT HEART CATHETERIZATION WITH CORONARY ANGIOGRAM;  Surgeon: Vinie KYM Maxcy, MD;  Location: [facility] CATH LAB;  Service: Cardiovascular;  Laterality: N/A;   SHOULDER ARTHROSCOPY WITH ROTATOR CUFF REPAIR AND SUBACROMIAL DECOMPRESSION Left 06/18/2016   Procedure: Left shouler arthroscopy, subacromial decompression, mini open rotator cuff repair;  Surgeon: Duwayne Purchase, MD;  Location: [facility] ORS;  Service: Orthopedics;  Laterality: Left;  2 hrs   SHOULDER ARTHROSCOPY WITH ROTATOR CUFF REPAIR AND SUBACROMIAL DECOMPRESSION Right 05/01/2022   Procedure: REVISION RIGHT SHOULDER ARTHROSCOPY WITH MINI OPEN ROTATOR CUFF REPAIR AND SUBACROMIAL DECOMPRESSION, POSSIBLE PATCH GRAFT;  Surgeon: Duwayne Purchase, MD;  Location: [facility];  Service: Orthopedics;  Laterality: Right;   WISDOM TOOTH EXTRACTION       Allergies  Allergen Reactions   Statins Diarrhea and Nausea Only    Leg cramps/GI upset  Lipitor and Crestor    Bee Venom Other (See Comments)   Sildenafil Other (See Comments)   Atorvastatin Diarrhea    Current Outpatient Medications  Medication Instructions   aspirin  EC 81 mg, Oral, 2 times daily after meals, Day after surgery   b complex vitamins capsule 1 capsule, Oral, Daily   cephALEXin  (KEFLEX ) 500 mg, Oral, 4 times daily, Starting 6 hours after scheduled surgery time   docusate sodium  (COLACE) 100 mg, Oral, 2 times daily PRN   Fish Oil 1,000 mg, Oral, Daily   FLUoxetine  (PROZAC ) 40 mg, Daily    lisinopril -hydrochlorothiazide  (PRINZIDE ,ZESTORETIC ) 20-25 MG per tablet 1 tablet, Daily   methocarbamol  (ROBAXIN ) 500 mg, Oral, Every 8 hours PRN   Multiple Vitamins-Minerals (CENTRUM SILVER 50+MEN) TABS 1 tablet, Oral, Daily   Naphazoline HCl (CLEAR EYES OP) 1 drop, Both Eyes, Every morning   oxyCODONE  (OXY IR/ROXICODONE ) 5 mg, Oral, Every 4 hours PRN   pravastatin (PRAVACHOL) 20 mg, Daily at bedtime   tadalafil (CIALIS) 5 mg, Daily   Vitamin D 2,000 Units, Daily   vitamin E 100 Units, Daily     VITALS:   Vitals:   12/15/23 0746  BP: 123/70  Pulse: 64  Resp: 20  SpO2: 99%  Weight: 191 lb (86.6 kg)  Height: 5' 9 (1.753 m)         12/15/2023    1:00 PM  Montreal Cognitive Assessment   Visuospatial/ Executive (0/5) 5  Naming (0/3) 3  Attention: Read list of digits (0/2) 2  Attention: Read list of letters (0/1) 1  Attention: Serial 7 subtraction starting at 100 (0/3) 3  Language: Repeat phrase (0/2) 2  Language : Fluency (0/1) 1  Abstraction (0/2) 1  Delayed Recall (0/5) 5  Orientation (0/6) 6  Total 29  Adjusted Score (based on education) 29        No data to display           Neurological Exam    Orientation:  Alert and  oriented to person, place and time. No aphasia or dysarthria. Fund of knowledge is appropriate. Recent memory impaired and remote memory intact.  Attention and concentration are normal.  Able to name objects and repeat phrases. Delayed recall  5/5 Cranial nerves: There is good facial symmetry. Extraocular muscles are intact and visual fields are full to confrontational testing. Speech is fluent and clear, significantly tangential. No tongue deviation. Hearing is decreased to conversational tone. Tone: Tone is good throughout. Abnormal movements: No tremors. No Asterixis. No Fasciculations Sensation: Sensation is intact to light touch. Vibration is intact at the bilateral big toe.  Coordination: The patient has no difficulty with RAM's or FNF  bilaterally. Normal finger to nose  Motor: Strength is 5/5 in the bilateral upper and lower extremities. There is no pronator drift. There are no fasciculations noted. DTR's: Deep tendon reflexes are 2/4 bilaterally. Gait and Station: The patient is able to ambulate without difficulty The patient is able to heel toe walk. Gait is cautious and narrow. The patient is able to ambulate in a tandem fashion.       Thank you for allowing us  the opportunity to participate in the care of this nice patient. Please do not hesitate to contact us  for any questions or concerns.   Total time spent on today's visit was 56 minutes dedicated to this patient today, preparing to see patient, examining the patient, ordering tests and/or medications and counseling the patient, documenting clinical information in the EHR or other health record, independently interpreting results and communicating results to the patient/family, discussing treatment and goals, answering patient's questions and coordinating care.  Cc:  Seabron Lenis, MD  Camie Sevin 12/15/2023 2:00 PM

## 2023-12-15 ENCOUNTER — Ambulatory Visit (INDEPENDENT_AMBULATORY_CARE_PROVIDER_SITE_OTHER): Payer: Self-pay | Admitting: Physician Assistant

## 2023-12-15 ENCOUNTER — Other Ambulatory Visit

## 2023-12-15 ENCOUNTER — Ambulatory Visit

## 2023-12-15 ENCOUNTER — Encounter: Payer: Self-pay | Admitting: Physician Assistant

## 2023-12-15 VITALS — BP 123/70 | HR 64 | Resp 20 | Ht 69.0 in | Wt 191.0 lb

## 2023-12-15 DIAGNOSIS — R413 Other amnesia: Secondary | ICD-10-CM | POA: Diagnosis not present

## 2023-12-15 DIAGNOSIS — F909 Attention-deficit hyperactivity disorder, unspecified type: Secondary | ICD-10-CM | POA: Diagnosis not present

## 2023-12-15 LAB — TSH: TSH: 1.61 m[IU]/L (ref 0.40–4.50)

## 2023-12-16 ENCOUNTER — Ambulatory Visit: Payer: Self-pay | Admitting: Physician Assistant

## 2023-12-21 ENCOUNTER — Telehealth: Payer: Self-pay | Admitting: Medical Genetics

## 2023-12-21 LAB — GENECONNECT MOLECULAR SCREEN

## 2023-12-22 NOTE — Telephone Encounter (Signed)
 [place]  12/22/2023 10:41 AM  Confirmed I was speaking with George Graham [phone] by using name and DOB. Informed participant the reason for this call is to follow-up on a recent sample the participant provided at one of the [facility] lab locations. Informed participant the test was not able to be completed with this sample and apologized for the inconvenience. Participant was requested to provide a new sample at one of our participating labs at no cost so that participant can continue participation and receive test results. Informed participant they do not need to be fasting and if there are other samples that need to be drawn, they can be done at the same visit. Participant has not had a blood transfusion or blood product in the last 30 days. Participant agreed to provide another sample. Participant was provided the [employer] program website to learn why this may have happened. Participant was thanked for their time and continued support of the above study.    Jordyn Pennstrom, BS [place]  [facility] Clinical Research Specialist II Direct Dial: [phone]  Fax: [phone]

## 2023-12-27 ENCOUNTER — Ambulatory Visit

## 2023-12-27 ENCOUNTER — Ambulatory Visit: Admitting: Psychology

## 2023-12-27 DIAGNOSIS — R4189 Other symptoms and signs involving cognitive functions and awareness: Secondary | ICD-10-CM

## 2023-12-27 DIAGNOSIS — F418 Other specified anxiety disorders: Secondary | ICD-10-CM

## 2023-12-27 NOTE — Progress Notes (Unsigned)
 NEUROPSYCHOLOGICAL EVALUATION [facility]  [place] Department of Neurology  Date of Evaluation: 12/27/2023  REASON FOR REFERRAL   George Graham is a 68 year old, {KDEISSHANDEDNESS:32240}-handed, White male with *** years of formal education. He was referred for neuropsychological evaluation by Camie Sevin, PA-C, to assess current neurocognitive functioning, document potential cognitive deficits, and assist with treatment planning. This is his first neuropsychological evaluation.  SUMMARY OF RESULTS   Premorbid cognitive abilities are estimated to be in the *** range based on word reading and sociodemographic factors. 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  ICD-10 Codes: ***  RECOMMENDATIONS   A repeat neuropsychological evaluation in *** months (or sooner if functional decline is noted) is recommended.  In consultation with your doctor, schedule cognitive reevaluation on an as-needed basis to assess for cognitive decline and update treatment recommendations. Reevaluation should occur during a period of medical and affective stability.***  Findings from this evaluation raise concern about the patient's ability to safely drive.*** Performance across neurocognitive testing is not a strong predictor of an individual's safety operating a motor vehicle. Should *** family wish to pursue a formalized driving evaluation, they could reach out to the following agencies:  [facility] [employer] [facility] in [place]:  [phone] [facility] in [place]: [phone] [facility] in [place]: [phone] [facility] in [place]: [phone] or [phone]  Continue treatment for ***, especially given that emotional distress can exacerbate cognitive difficulties. Discuss current medication regimen with your prescribing provider to ensure you are receiving maximum benefit. If symptoms begin to interfere with daily functioning, you may wish to reconsider exploring additional treatment options, such as mindfulness, relaxation techniques, or counseling.  Prioritize physical health through diet, exercise, and sleep. Regular physical activity supports cardiovascular health, improves mood, and helps preserve mobility and independence. Aim for at least 150 minutes of moderate aerobic exercise per week (e.g., brisk walking, swimming, gardening). A brain-healthy diet such as the Mediterranean or MIND diet is rich in fruits, vegetables, whole grains, healthy fats, and lean proteins, and has been associated with reduced risk of cognitive decline. Additionally, getting adequate, quality sleep and managing chronic conditions with the help of healthcare providers are essential components of healthy aging.  Continue to stay socially and mentally engaged. Maintaining strong social connections and regularly stimulating your brain can help protect against cognitive decline. This includes staying connected with friends and family, volunteering, or participating in community groups. Mentally engaging activities--such as reading, doing puzzles, playing strategy games, or learning a new language or musical instrument--promote brain plasticity. If you are interested in activities to support cognitive engagement, this site offers a variety of apps and games organized by difficulty  level:  https://www.[facility].org/get-to-know-barrow/centers-programs/neurorehabilitation-center/neuro-rehab-apps-and-games/  Consider implementing compensatory strategies to maximize independence and maintain daily functioning. Examples include:  Adhere to routine. Compensatory strategies work best when they are used consistently. Use a planner, calendar, or white board that has the schedule and important events for the day clearly listed to reference and cross off when tasks are complete.  Ask for written information, especially if it is new or unfamiliar (e.g., information provided at a doctor's  appointment).  Create an organized environment. Keep items that can be easily misplaced in a sensible location and get into the habit of always returning the items to those places. Pay attention and reduce distractions. Make a point of focusing attention on information you want to remember. One-on-one interaction is more likely to facilitate attention and minimize distraction. Make eye contact and repeat the information out loud after you hear it. Reduce interruptions or distractions especially when attempting to learn new information.  Create associations. When learning something new, think about and understand the information. Explain it in your own words or try to associate it with something you already know. Take notes to help remember important details. Evaluate goals and plan accordingly. When confronted by many different tasks, begin by making a list that prioritizes each task and estimates the time it will take to complete. Break down complicated tasks into smaller, more manageable steps. Focus on one task at a time and complete each task before starting another. Avoid multitasking.  {KDEISSSUBSTANCERECS:32199}  DISPOSITION   Patient will follow up with the referring provider, Ms. Wertman. {KDEISSFOLLOWUP:32247}. He will be provided verbal feedback in approximately one week regarding the  findings and impression during this visit.  The remainder of the report includes the details of the patient's background and a table of results from the current evaluation, which support the summary and recommendations described above.  BACKGROUND   History of Presenting Illness: The following information was obtained from a review of medical records and an interview with the patient. ***Briefly, the patient recently presented to the ED on 11/06/2023 for decreased cognitive function (although this had been going on for longer) and repetitive speech. At the time he reopirted that he and went for a run, which he typically does. Typically the patient runs approximately 8 miles. Today he decided to run 13 miles. Following this run he felt crampiness, weakness, had to lay on the floor, had minimal improvement with pickle juice, and now presents for evaluation. Presentation was concerning for hypotension, hypovolemia, rhabdomyolysis. Due to the reported baseline decrease in cognitive capacity he was recommened to be evaluated outpatient. He was initially evaluated by Camie Sevin, PA-C, at [facility] on 12/15/2023 for cognitive changes (e.g., forgetting items when shopping, needing reminders for tasks) since October. MoCA = 29/30. Of note,     He has a history of ADD, for which he is currently taking methylphenidate. He has previously been on Ritalin but discontinued it due to side effects. He also has a history of depression, for which he takes fluoxetine . He sleeps well, aided by Klonopin for anxiety,  Previous Neuropsychological Evaluation(s): ***  Cognitive Functioning: During today's appointment, the patient reported ***.  Physical Functioning: Patient denied difficulties with sleep initiation and maintenance. Appetite is stable. No changes to sense of taste or smell were reported. Vision and hearing are stable. ***RBD, falls, tremor, speech, incontinence, pain***  Emotional Functioning:  Patient reported his current mood as ***. 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  Other Relevant Medical History: Remarkable for hypertension, hyperlipidemia, type 2 diabetes***, and benign prostatic hyperplasia***. Please refer to the medical record for a more comprehensive problem list. No history of  stroke, CNS infection, head injury, or seizure was reported.***  Current Medications: Per record, ***B complex vitamins, docusate sodium , fluoxetine , lisinopril -hydrochlorothiazide , naphazoline, pravastatin, tadalafil, vitamin D3, and vitamin E.  Functional Status: Patient independently performs all ADLs and IADLs, including driving, without difficulty.***  Family Neurological History: Remarkable for *** Parkinson's disease dementia in the patient's mother and early-onset dementia in a first cousin.   Psychiatric History: Remarkable for depression and anxiety, currently treated with medicaiton ***.  History of depression, anxiety, prior mental health treatment, suicidal ideation, hallucinations, and psychiatric hospitalizations was not reported.  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   Social and Developmental History: Patient was born in ***. History of perinatal complications and developmental delays was not reported. Language ***. Patient is *** and has *** children who live locally. Patient resides in ***.  Educational and Occupational History: No history of  childhood learning disability, special education services, or grade retention was reported. Patient completed *** or obtained a *** degree. Patient was employed as ***. Patient retired in [employer]   Patient arrived on time and was unaccompanied. He ambulated independently and without gait disturbance. He was alert and fully oriented. He was appropriately groomed and dressed for the setting. No significant sensory or motor abnormalities were observed. Vision (with reading glasses) was adequate for testing purposes; hearing was more of an issue, as he does not wear hearing aids and all providers were required to wear masks. Speech was intermittently dysfluent despite how talkative he was; however, this appeared more consistent with anxiety rather than an effortful, halting, or aphasic speech pattern, particularly given observed periods of fluent, spontaneous speech. No conversational word-finding difficulties, paraphasic errors, or dysarthria were observed. Comprehension was conversationally intact. Thought processes were notably circuitous, and he demonstrated difficulty providing direct responses to interview questions despite multiple direct prompts. He responded better to the structured testing environment and was better able to stay on track. Thought content was organized and devoid of delusions. Insight appeared appropriate. Affect was congruent with anxious mood. He was cooperative and appeared to give adequate effort during testing. Results are thought to accurately reflect his cognitive functioning at this time.  NEUROPSYCHOLOGICAL TESTING RESULTS   Tests Administered: {KDEISSNPTESTS:32266}  Test results are provided in the table below. Whenever possible, the patient's scores were compared against age-, sex-, and education-corrected normative samples. Interpretive descriptions are based on the AACN consensus conference statement on uniform labeling (Guilmette et al.,  2020).  ***  INFORMED CONSENT   Patient was provided with a verbal description of the nature and purpose of the neuropsychological evaluation. Also reviewed were the foreseeable risks and/or discomforts and benefits of the procedure, limits of confidentiality, and mandatory reporting requirements of this provider. Patient was given the opportunity to have their questions answered. Oral consent to participate was provided by the patient.   This report was prepared as part of a clinical evaluation and is not intended for forensic use.  SERVICE   This evaluation was conducted by Renda Beckwith, Psy.D. In addition to time spent directly with the patient, total professional time (*** minutes) includes record review, integration of relevant medical history, test selection, interpretation of findings, and report preparation. A technician, Evalene Pizza, B.S., provided testing and scoring assistance (*** minutes).  Psychiatric Diagnostic Evaluation Services (Professional): 09208 x 1 Neuropsychological Testing Evaluation Services (Professional): 03867 x 1  Neuropsychological Dance Movement Psychotherapist (Professional): 03866 x *** Neuropsychological Test Administration and Scoring (Technician): [phone] x 1 Neuropsychological Test Administration and Scoring (Technician): [phone] x ***  This report was generated using voice recognition software. While this document has been carefully reviewed, transcription errors may be present. I apologize in advance for any inconvenience. Please contact me if further clarification is needed.            Renda Beckwith, Psy.D.             Neuropsychologist

## 2023-12-28 NOTE — Progress Notes (Addendum)
° °  Psychometrician Note   Cognitive testing was administered to George Graham by Evalene Pizza, B.S. (psychometrist) under the supervision of Dr. Renda Beckwith, Psy.D., licensed psychologist on 12/27/2023. George Graham did not appear overtly distressed by the testing session per behavioral observation or responses across self-report questionnaires. Rest breaks were offered.   The battery of tests administered was selected by Dr. Renda Beckwith, Psy.D. with consideration to George Graham's current level of functioning, the nature of his symptoms, emotional and behavioral responses during interview, level of literacy, observed level of motivation/effort, and the nature of the referral question. This battery was communicated to the psychometrist. Communication between Dr. Renda Beckwith, Psy.D. and the psychometrist was ongoing throughout the evaluation and Dr. Renda Beckwith, Psy.D. was immediately accessible at all times. Dr. Renda Beckwith, Psy.D. provided supervision to the psychometrist on the date of this service to the extent necessary to assure the quality of all services provided.    George Graham will return within approximately 1-2 weeks for an interactive feedback session with Dr. Beckwith at which time his test performances, clinical impressions, and treatment recommendations will be reviewed in detail. George Graham understands he can contact our office should he require our assistance before this time.  A total of 184 minutes of billable time were spent face-to-face with George Graham by the psychometrist. This includes both test administration and scoring time. Billing for these services is reflected in the clinical report generated by Dr. Renda Beckwith, Psy.D.  This note reflects time spent with the psychometrician and does not include test scores or any clinical interpretations made by Dr. Beckwith. The full report will follow in a separate note.

## 2024-01-03 ENCOUNTER — Encounter: Admitting: Psychology

## 2024-01-03 DIAGNOSIS — F418 Other specified anxiety disorders: Secondary | ICD-10-CM

## 2024-01-03 DIAGNOSIS — R4189 Other symptoms and signs involving cognitive functions and awareness: Secondary | ICD-10-CM | POA: Diagnosis not present

## 2024-01-03 NOTE — Progress Notes (Signed)
" ° °  NEUROPSYCHOLOGY FEEDBACK SESSION [name]  [place] Department of Neurology  Date of Feedback Session: 01/03/2024  REASON FOR REFERRAL   George Graham is a 68 year old, right-handed, White male with 15+ years of formal education. He was referred for neuropsychological evaluation by Camie Sevin, PA-C, to assess current neurocognitive functioning, document potential cognitive deficits, and assist with treatment planning. This is his first neuropsychological evaluation.  FEEDBACK   Patient completed a comprehensive neuropsychological evaluation on 12/27/2023. Please refer to that encounter for the full report and recommendations. Briefly, results indicated broadly normal test performance, with the exception of minimal isolated inefficiencies in alternating attention and new learning of certain material. Functional independence is maintained. At this time, the degree of cognitive findings is not consistent with a diagnosable cognitive disorder. Etiology is likely attributable to a combination of aging, frontal lobe changes observed on MRI (suspicious for prior trauma, though ischemic, infectious, or inflammatory etiologies cannot be ruled out), chronic mental health problems, longstanding attentional difficulties, several recent personal stressors, and hearing loss. At present, there is no strong evidence to suggest an emerging neurodegenerative process.   Today, the patient was accompanied by his wife. They were provided verbal feedback regarding the findings and impression during this visit, and their questions were answered. A copy of the report was provided at the conclusion of the visit.  DISPOSITION   Patient will follow up with the referring provider, Ms. Wertman. No follow-up neuropsychological testing was scheduled at this time. Please feel free to refer the patient for repeated evaluation if he shows a significant change in neurocognitive status.  SERVICE    This feedback session was conducted by Renda Beckwith, Psy.D. One unit of 03867 (40 minutes) was billed for Dr. Beckwith' time spent in preparing, conducting, and documenting the current feedback session.  This report was generated using voice recognition software. While this document has been carefully reviewed, transcription errors may be present. I apologize in advance for any inconvenience. Please contact me if further clarification is needed.  "

## 2024-01-22 LAB — GENECONNECT MOLECULAR SCREEN: Genetic Analysis Overall Interpretation: NEGATIVE

## 2024-03-14 ENCOUNTER — Ambulatory Visit: Admitting: Physician Assistant
